# Patient Record
Sex: Female | Born: 1975 | Race: Black or African American | Hispanic: No | Marital: Married | Smoking: Current every day smoker
Health system: Southern US, Community
[De-identification: ages and names within clinical notes are randomized; demographics above are authoritative.]

## PROBLEM LIST (undated history)

## (undated) DIAGNOSIS — M79606 Pain in leg, unspecified: Secondary | ICD-10-CM

## (undated) DIAGNOSIS — E119 Type 2 diabetes mellitus without complications: Secondary | ICD-10-CM

## (undated) DIAGNOSIS — I10 Essential (primary) hypertension: Secondary | ICD-10-CM

## (undated) DIAGNOSIS — I509 Heart failure, unspecified: Secondary | ICD-10-CM

## (undated) DIAGNOSIS — E7800 Pure hypercholesterolemia, unspecified: Secondary | ICD-10-CM

## (undated) DIAGNOSIS — I38 Endocarditis, valve unspecified: Secondary | ICD-10-CM

## (undated) HISTORY — DX: Pain in leg, unspecified: M79.606

---

## 2010-08-21 ENCOUNTER — Emergency Department (HOSPITAL_BASED_OUTPATIENT_CLINIC_OR_DEPARTMENT_OTHER)
Admission: EM | Admit: 2010-08-21 | Discharge: 2010-08-21 | Payer: Self-pay | Source: Home / Self Care | Admitting: Emergency Medicine

## 2012-07-12 ENCOUNTER — Encounter (HOSPITAL_BASED_OUTPATIENT_CLINIC_OR_DEPARTMENT_OTHER): Payer: Self-pay | Admitting: *Deleted

## 2012-07-12 ENCOUNTER — Emergency Department (HOSPITAL_BASED_OUTPATIENT_CLINIC_OR_DEPARTMENT_OTHER)
Admission: EM | Admit: 2012-07-12 | Discharge: 2012-07-12 | Disposition: A | Discharge status: Home / Self Care | Payer: Medicaid Other | Attending: Emergency Medicine | Admitting: Emergency Medicine

## 2012-07-12 DIAGNOSIS — I10 Essential (primary) hypertension: Secondary | ICD-10-CM | POA: Insufficient documentation

## 2012-07-12 DIAGNOSIS — F17200 Nicotine dependence, unspecified, uncomplicated: Secondary | ICD-10-CM | POA: Insufficient documentation

## 2012-07-12 DIAGNOSIS — B86 Scabies: Secondary | ICD-10-CM | POA: Insufficient documentation

## 2012-07-12 DIAGNOSIS — L299 Pruritus, unspecified: Secondary | ICD-10-CM | POA: Insufficient documentation

## 2012-07-12 DIAGNOSIS — Z79899 Other long term (current) drug therapy: Secondary | ICD-10-CM | POA: Insufficient documentation

## 2012-07-12 HISTORY — DX: Essential (primary) hypertension: I10

## 2012-07-12 MED ORDER — PERMETHRIN 5 % EX CREA: TOPICAL_CREAM | CUTANEOUS | Status: DC

## 2012-07-12 MED ORDER — HYDROCHLOROTHIAZIDE 25 MG PO TABS: 25.0000 mg | ORAL_TABLET | Freq: Every day | ORAL | Status: DC

## 2012-07-12 NOTE — ED Notes (Signed)
Pt amb to triage with quick steady gait in nad. Pt reports itching all over x 2 weeks. States her sons stayed with a relative she believes to have scabies.

## 2012-07-12 NOTE — ED Provider Notes (Signed)
History     CSN: [id number]  Arrival date & time 07/12/12  1057   First MD Initiated Contact with Patient 07/12/12 1135      Chief Complaint  Patient presents with  . Rash    (Consider location/radiation/quality/duration/timing/severity/associated sxs/prior treatment) Patient is a 36 y.o. female presenting with rash.  Rash  This is a new problem. Episode onset: 2-3 weeks ago. The problem has been gradually worsening. Associated with: Other family members with same. There has been no fever. Affected Location: Diffuse but worse over the upper arm and neck. The pain is at a severity of 0/10. The patient is experiencing no pain. The pain has been constant since onset. Associated symptoms include itching. She has tried nothing for the symptoms. The treatment provided no relief.    Past Medical History  Diagnosis Date  . Hypertension     History reviewed. No pertinent past surgical history.  History reviewed. No pertinent family history.  History  Substance Use Topics  . Smoking status: Current Every Day Smoker  . Smokeless tobacco: Not on file  . Alcohol Use:     OB History    Grav Para Term Preterm Abortions TAB SAB Ect Mult Living                  Review of Systems  Skin: Positive for itching and rash.  All other systems reviewed and are negative.    Allergies  Review of patient's allergies indicates no known allergies.  Home Medications   Current Outpatient Rx  Name  Route  Sig  Dispense  Refill  . HYDROCHLOROTHIAZIDE 25 MG PO TABS   Oral   Take 1 tablet (25 mg total) by mouth daily.   30 tablet   3   . PERMETHRIN 5 % EX CREA      Apply to affected area once. Go to bed at night after you've apply the cream to wash off in the morning. If you're still itching in one week reapply a second time   60 g   0     BP 164/100  Pulse 92  Temp 98.2 F (36.8 C) (Oral)  Resp 18  SpO2 100%  LMP 07/04/2012  Physical Exam  Nursing note and vitals  reviewed. Constitutional: She is oriented to person, place, and time. She appears well-developed and well-nourished. No distress.  HENT:  Head: Normocephalic and atraumatic.  Eyes: EOM are normal. Pupils are equal, round, and reactive to light.  Pulmonary/Chest: Effort normal.  Neurological: She is alert and oriented to person, place, and time.  Skin: Skin is warm and dry. Rash noted. No erythema. No pallor.       Maculopapular excoriated rash more concentrated over the upper extremities and chest    ED Course  Procedures (including critical care time)  Labs Reviewed - No data to display No results found.   1. Scabies   2. Hypertension       MDM   Patient with evidence of scabies. Multiple other family members with similar symptoms. Given permethrin cream  Also patient is hypertensive here today but states she ran out of her hydrochlorothiazide 3 weeks ago. It was refill.        Gwyneth Sprout, MD 07/12/12 1515

## 2012-07-25 ENCOUNTER — Emergency Department (HOSPITAL_BASED_OUTPATIENT_CLINIC_OR_DEPARTMENT_OTHER)
Admission: EM | Admit: 2012-07-25 | Discharge: 2012-07-25 | Disposition: A | Discharge status: Home / Self Care | Payer: Medicaid Other | Attending: Emergency Medicine | Admitting: Emergency Medicine

## 2012-07-25 ENCOUNTER — Encounter (HOSPITAL_BASED_OUTPATIENT_CLINIC_OR_DEPARTMENT_OTHER): Payer: Self-pay | Admitting: *Deleted

## 2012-07-25 DIAGNOSIS — I10 Essential (primary) hypertension: Secondary | ICD-10-CM | POA: Insufficient documentation

## 2012-07-25 DIAGNOSIS — F17200 Nicotine dependence, unspecified, uncomplicated: Secondary | ICD-10-CM | POA: Insufficient documentation

## 2012-07-25 DIAGNOSIS — B86 Scabies: Secondary | ICD-10-CM | POA: Insufficient documentation

## 2012-07-25 MED ORDER — PERMETHRIN 5 % EX CREA: TOPICAL_CREAM | CUTANEOUS | Status: DC

## 2012-07-25 NOTE — ED Notes (Signed)
Pt c/o rash seen 11/19 for scabies and treated w/o relief

## 2012-07-25 NOTE — ED Provider Notes (Signed)
Medical screening examination/treatment/procedure(s) were performed by non-physician practitioner and as supervising physician I was immediately available for consultation/collaboration.   Charles B. Bernette Mayers, MD 07/25/12 [phone]

## 2012-07-25 NOTE — ED Provider Notes (Signed)
History     CSN: [id number]  Arrival date & time 07/25/12  1523   First MD Initiated Contact with Patient 07/25/12 1539      Chief Complaint  Patient presents with  . Rash    (Consider location/radiation/quality/duration/timing/severity/associated sxs/prior treatment) Patient is a 36 y.o. female presenting with rash. The history is provided by the patient. No language interpreter was used.  Rash  This is a new problem. The current episode started more than 1 week ago. The problem has been gradually worsening. The rash is present on the torso, abdomen and trunk. The patient is experiencing no pain. Associated symptoms include itching. She has tried nothing for the symptoms.    Past Medical History  Diagnosis Date  . Hypertension     History reviewed. No pertinent past surgical history.  History reviewed. No pertinent family history.  History  Substance Use Topics  . Smoking status: Current Every Day Smoker  . Smokeless tobacco: Not on file  . Alcohol Use:     OB History    Grav Para Term Preterm Abortions TAB SAB Ect Mult Living                  Review of Systems  Skin: Positive for itching and rash.  All other systems reviewed and are negative.    Allergies  Review of patient's allergies indicates no known allergies.  Home Medications   Current Outpatient Rx  Name  Route  Sig  Dispense  Refill  . HYDROCHLOROTHIAZIDE 25 MG PO TABS   Oral   Take 1 tablet (25 mg total) by mouth daily.   30 tablet   3   . PERMETHRIN 5 % EX CREA      Apply to affected area once. Go to bed at night after you've apply the cream to wash off in the morning. If you're still itching in one week reapply a second time   60 g   2     BP 185/99  Pulse 118  Temp 98.9 F (37.2 C) (Oral)  Resp 16  SpO2 100%  LMP 07/04/2012  Physical Exam  Nursing note and vitals reviewed. Constitutional: She is oriented to person, place, and time. She appears well-developed and  well-nourished.  HENT:  Head: Normocephalic.  Eyes: Pupils are equal, round, and reactive to light.  Cardiovascular: Normal rate.   Abdominal: Soft.  Musculoskeletal: Normal range of motion.  Neurological: She is alert and oriented to person, place, and time.  Skin: Rash noted.  Psychiatric: She has a normal mood and affect.    ED Course  Procedures (including critical care time)  Labs Reviewed - No data to display No results found.   1. Scabies       MDM  elemite        Lonia Skinner [place], [place] 07/25/12 1609

## 2012-08-16 ENCOUNTER — Emergency Department (HOSPITAL_BASED_OUTPATIENT_CLINIC_OR_DEPARTMENT_OTHER)
Admission: EM | Admit: 2012-08-16 | Discharge: 2012-08-16 | Disposition: A | Discharge status: Home / Self Care | Payer: Medicaid Other | Attending: Emergency Medicine | Admitting: Emergency Medicine

## 2012-08-16 ENCOUNTER — Encounter (HOSPITAL_BASED_OUTPATIENT_CLINIC_OR_DEPARTMENT_OTHER): Payer: Self-pay | Admitting: *Deleted

## 2012-08-16 DIAGNOSIS — L299 Pruritus, unspecified: Secondary | ICD-10-CM | POA: Insufficient documentation

## 2012-08-16 DIAGNOSIS — Z79899 Other long term (current) drug therapy: Secondary | ICD-10-CM | POA: Insufficient documentation

## 2012-08-16 DIAGNOSIS — F17200 Nicotine dependence, unspecified, uncomplicated: Secondary | ICD-10-CM | POA: Insufficient documentation

## 2012-08-16 DIAGNOSIS — B86 Scabies: Secondary | ICD-10-CM

## 2012-08-16 DIAGNOSIS — I10 Essential (primary) hypertension: Secondary | ICD-10-CM | POA: Insufficient documentation

## 2012-08-16 MED ORDER — HYDROXYZINE HCL 25 MG PO TABS: 25.0000 mg | ORAL_TABLET | Freq: Four times a day (QID) | ORAL | Status: DC

## 2012-08-16 MED ORDER — PERMETHRIN 5 % EX CREA: TOPICAL_CREAM | CUTANEOUS | Status: DC

## 2012-08-16 MED ORDER — LORAZEPAM 1 MG PO TABS: 1.0000 mg | ORAL_TABLET | Freq: Three times a day (TID) | ORAL | Status: DC

## 2012-08-16 NOTE — ED Notes (Signed)
States she has been treated for scabies several times but cannot seem to get rid of them. Now her family members have it.

## 2012-08-16 NOTE — ED Provider Notes (Signed)
History     CSN: [id number]  Arrival date & time 08/16/12  1635   First MD Initiated Contact with Patient 08/16/12 1749      Chief Complaint  Patient presents with  . Rash    (Consider location/radiation/quality/duration/timing/severity/associated sxs/prior treatment) Patient is a 36 y.o. female presenting with rash. The history is provided by the patient. No language interpreter was used.  Rash  This is a new problem. The current episode started more than 1 week ago. The problem has been gradually worsening. The problem is associated with nothing. There has been no fever. The rash is present on the abdomen and torso. The pain is moderate. Associated symptoms include itching. Treatments tried: elemite.  Pt reports she has scabies again.  Pt complains that she and her son are itching.   Pt reports itching all night.  Pt reports worrying about scabies and having problems with anxiety  Past Medical History  Diagnosis Date  . Hypertension     History reviewed. No pertinent past surgical history.  No family history on file.  History  Substance Use Topics  . Smoking status: Current Every Day Smoker -- 1.5 packs/day    Types: Cigarettes  . Smokeless tobacco: Not on file  . Alcohol Use: Yes    OB History    Grav Para Term Preterm Abortions TAB SAB Ect Mult Living                  Review of Systems  Skin: Positive for itching and rash.  All other systems reviewed and are negative.    Allergies  Review of patient's allergies indicates no known allergies.  Home Medications   Current Outpatient Rx  Name  Route  Sig  Dispense  Refill  . HYDROCHLOROTHIAZIDE 25 MG PO TABS   Oral   Take 1 tablet (25 mg total) by mouth daily.   30 tablet   3   . HYDROXYZINE HCL 25 MG PO TABS   Oral   Take 1 tablet (25 mg total) by mouth every 6 (six) hours.   12 tablet   0   . LORAZEPAM 1 MG PO TABS   Oral   Take 1 tablet (1 mg total) by mouth every 8 (eight) hours.   10  tablet   0   . PERMETHRIN 5 % EX CREA      Apply to affected area once. Go to bed at night after you've apply the cream to wash off in the morning. If you're still itching in one week reapply a second time   60 g   2   . PERMETHRIN 5 % EX CREA      Apply to affected area once   120 g   1     BP 177/98  Pulse 93  Temp 98.4 F (36.9 C) (Oral)  Resp 18  SpO2 100%  Physical Exam  Nursing note and vitals reviewed. Constitutional: She is oriented to person, place, and time. She appears well-developed and well-nourished.  HENT:  Head: Normocephalic.  Neck: Normal range of motion.  Cardiovascular: Normal rate and normal heart sounds.   Pulmonary/Chest: Effort normal and breath sounds normal.  Abdominal: Soft.  Musculoskeletal: Normal range of motion.  Neurological: She is alert and oriented to person, place, and time. She has normal reflexes.  Skin: Rash noted.  Psychiatric: She has a normal mood and affect.    ED Course  Procedures (including critical care time)  Labs Reviewed - No data  to display No results found.   1. Scabies   2. Itching       MDM  Pt has some dried scabed areas,  Pt son has eczema and is difficult to tell if he has scabies.   I will retreat.   Pt also given atarax for itching.   I gave ativan for anxiety for limited time         Elson Areas, [place] 08/16/12 2145  Lonia Skinner [place], [place] 08/16/12 2146

## 2012-08-17 NOTE — ED Provider Notes (Signed)
Medical screening examination/treatment/procedure(s) were performed by non-physician practitioner and as supervising physician I was immediately available for consultation/collaboration.  Derwood Kaplan, MD 08/17/12 [phone]

## 2012-12-12 ENCOUNTER — Encounter (HOSPITAL_BASED_OUTPATIENT_CLINIC_OR_DEPARTMENT_OTHER): Payer: Self-pay | Admitting: *Deleted

## 2012-12-12 ENCOUNTER — Emergency Department (HOSPITAL_BASED_OUTPATIENT_CLINIC_OR_DEPARTMENT_OTHER): Payer: Medicaid Other

## 2012-12-12 ENCOUNTER — Other Ambulatory Visit: Payer: Self-pay

## 2012-12-12 ENCOUNTER — Emergency Department (HOSPITAL_BASED_OUTPATIENT_CLINIC_OR_DEPARTMENT_OTHER)
Admission: EM | Admit: 2012-12-12 | Discharge: 2012-12-12 | Disposition: A | Discharge status: Home / Self Care | Payer: Medicaid Other | Attending: Emergency Medicine | Admitting: Emergency Medicine

## 2012-12-12 DIAGNOSIS — O99330 Smoking (tobacco) complicating pregnancy, unspecified trimester: Secondary | ICD-10-CM | POA: Insufficient documentation

## 2012-12-12 DIAGNOSIS — O2650 Maternal hypotension syndrome, unspecified trimester: Secondary | ICD-10-CM | POA: Insufficient documentation

## 2012-12-12 DIAGNOSIS — Z79899 Other long term (current) drug therapy: Secondary | ICD-10-CM | POA: Insufficient documentation

## 2012-12-12 DIAGNOSIS — R42 Dizziness and giddiness: Secondary | ICD-10-CM | POA: Insufficient documentation

## 2012-12-12 DIAGNOSIS — O9989 Other specified diseases and conditions complicating pregnancy, childbirth and the puerperium: Secondary | ICD-10-CM | POA: Insufficient documentation

## 2012-12-12 DIAGNOSIS — I951 Orthostatic hypotension: Secondary | ICD-10-CM

## 2012-12-12 DIAGNOSIS — Z3490 Encounter for supervision of normal pregnancy, unspecified, unspecified trimester: Secondary | ICD-10-CM

## 2012-12-12 DIAGNOSIS — E669 Obesity, unspecified: Secondary | ICD-10-CM | POA: Insufficient documentation

## 2012-12-12 DIAGNOSIS — O169 Unspecified maternal hypertension, unspecified trimester: Secondary | ICD-10-CM | POA: Insufficient documentation

## 2012-12-12 DIAGNOSIS — K625 Hemorrhage of anus and rectum: Secondary | ICD-10-CM | POA: Insufficient documentation

## 2012-12-12 DIAGNOSIS — K644 Residual hemorrhoidal skin tags: Secondary | ICD-10-CM | POA: Insufficient documentation

## 2012-12-12 LAB — URINALYSIS, ROUTINE W REFLEX MICROSCOPIC
Bilirubin Urine: NEGATIVE
Glucose, UA: NEGATIVE mg/dL
Hgb urine dipstick: NEGATIVE
Ketones, ur: 15 mg/dL — AB
Nitrite: NEGATIVE
Specific Gravity, Urine: 1.022 (ref 1.005–1.030)

## 2012-12-12 LAB — CBC WITH DIFFERENTIAL/PLATELET
Basophils Absolute: 0 10*3/uL (ref 0.0–0.1)
Basophils Relative: 0 % (ref 0–1)
Eosinophils Absolute: 0.2 10*3/uL (ref 0.0–0.7)
Eosinophils Relative: 2 % (ref 0–5)
HCT: 34.1 % — ABNORMAL LOW (ref 36.0–46.0)
Hemoglobin: 11.3 g/dL — ABNORMAL LOW (ref 12.0–15.0)
MCH: 25.8 pg — ABNORMAL LOW (ref 26.0–34.0)
MCHC: 33.1 g/dL (ref 30.0–36.0)
Monocytes Absolute: 0.8 10*3/uL (ref 0.1–1.0)
Neutrophils Relative %: 61 % (ref 43–77)

## 2012-12-12 LAB — COMPREHENSIVE METABOLIC PANEL
ALT: 19 U/L (ref 0–35)
BUN: 7 mg/dL (ref 6–23)
Creatinine, Ser: 0.9 mg/dL (ref 0.50–1.10)
Sodium: 138 mEq/L (ref 135–145)

## 2012-12-12 LAB — OCCULT BLOOD X 1 CARD TO LAB, STOOL: Fecal Occult Bld: POSITIVE — AB

## 2012-12-12 LAB — WET PREP, GENITAL: Yeast Wet Prep HPF POC: NONE SEEN

## 2012-12-12 LAB — URINE MICROSCOPIC-ADD ON

## 2012-12-12 IMAGING — US US OB TRANSVAGINAL
1 series · 14 of 18 positions shown · non-contrast
Comparison: None.

CLINICAL DATA: Hyperemesis.  Dizziness.

OBSTETRIC <14 WK US AND TRANSVAGINAL OB US
TECHNIQUE: Both transabdominal and transvaginal ultrasound
examinations were performed for complete evaluation of the
gestation as well as the maternal uterus, adnexal regions, and
pelvic cul-de-sac.  Transvaginal technique was performed to assess
early pregnancy.

[Series 1: us ob transvaginal · 0.26mm/px · 14 of 18 slices shown]
[im 1/18]
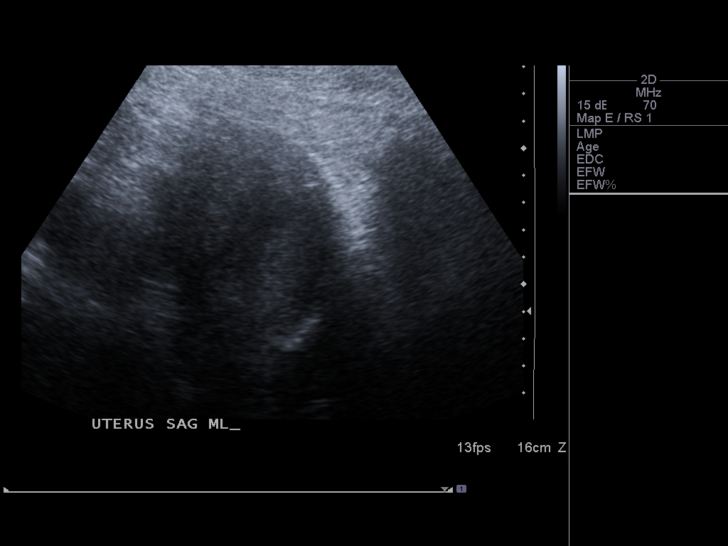
[im 2/18]
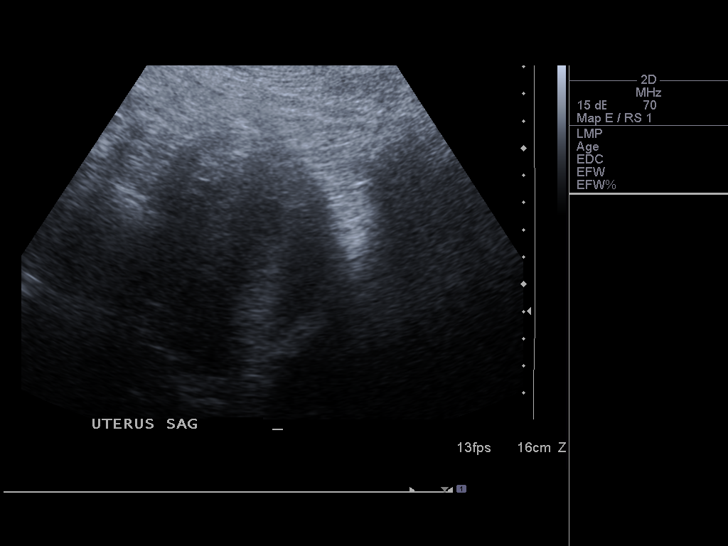
[im 4/18]
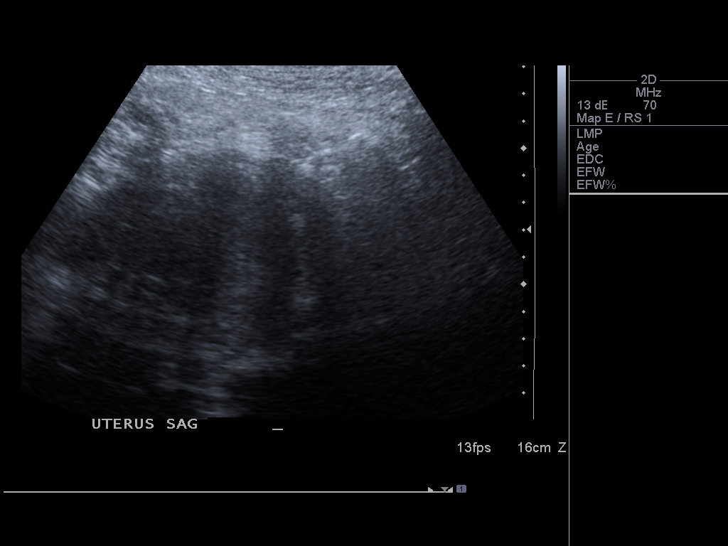
[im 5/18]
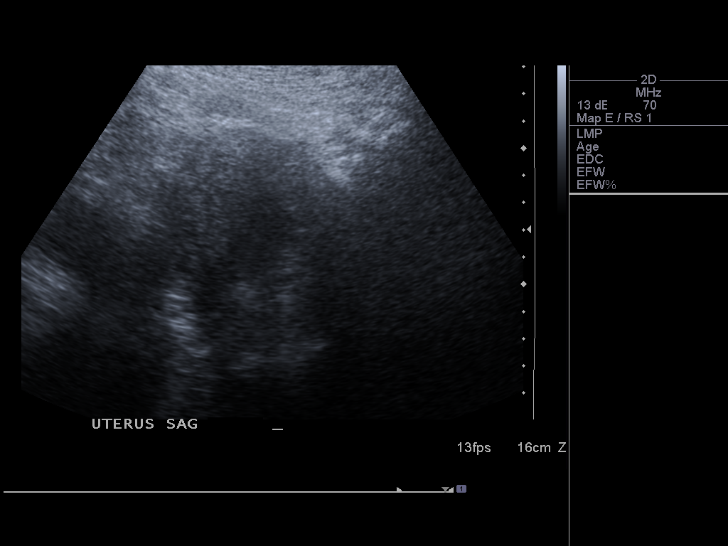
[im 6/18]
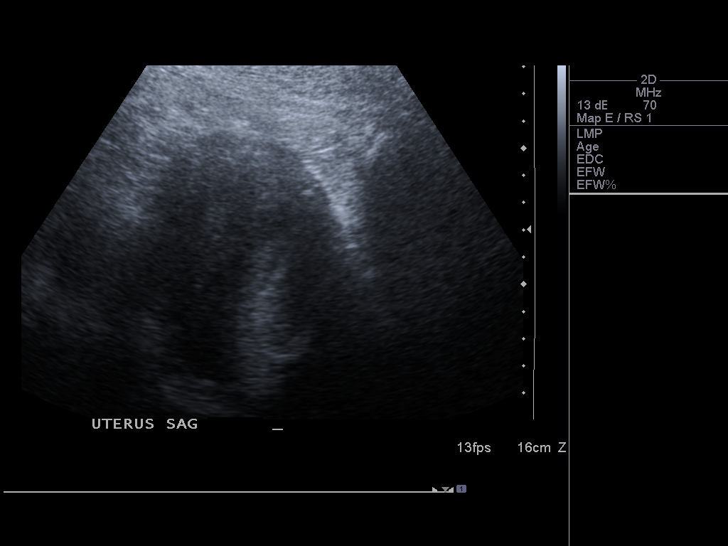
[im 8/18]
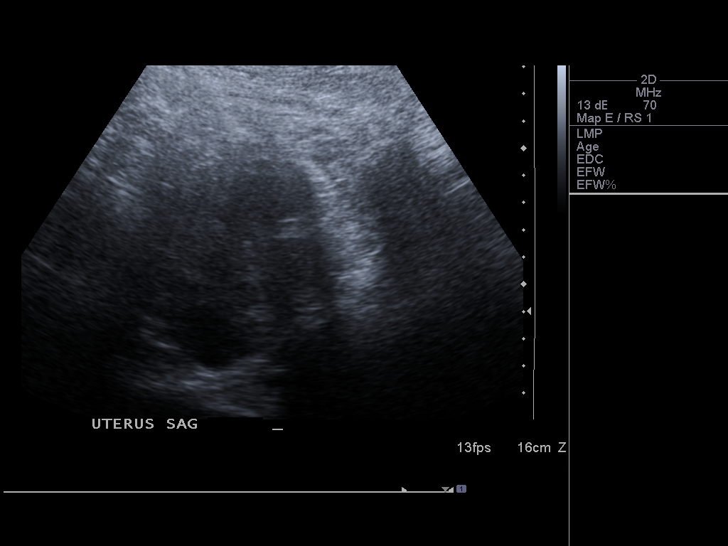
[im 9/18]
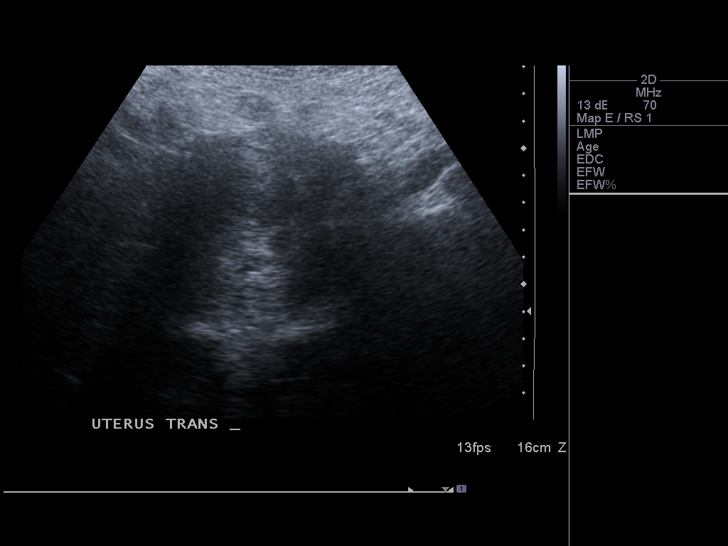
[im 10/18]
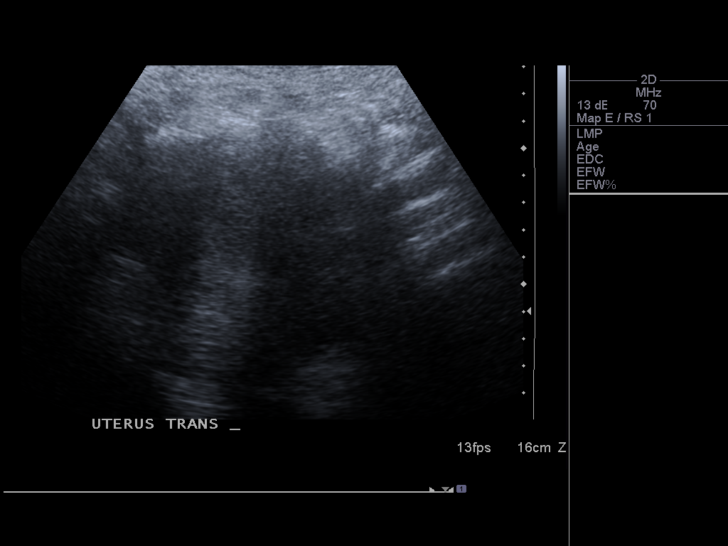
[im 11/18]
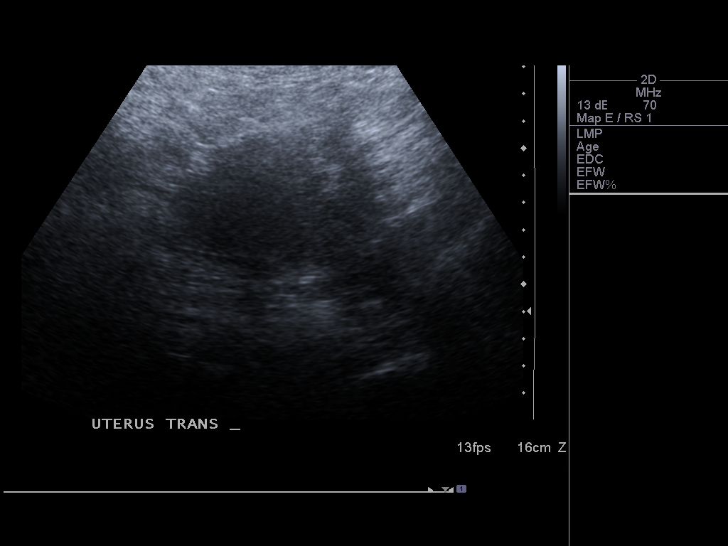
[im 13/18]
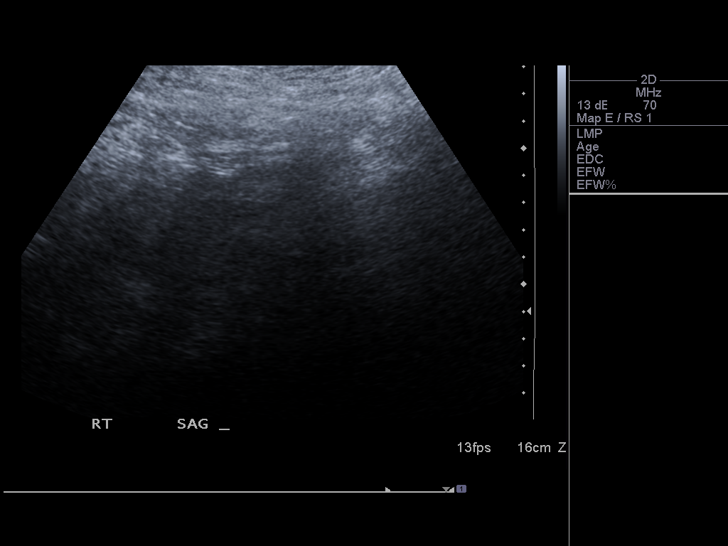
[im 14/18]
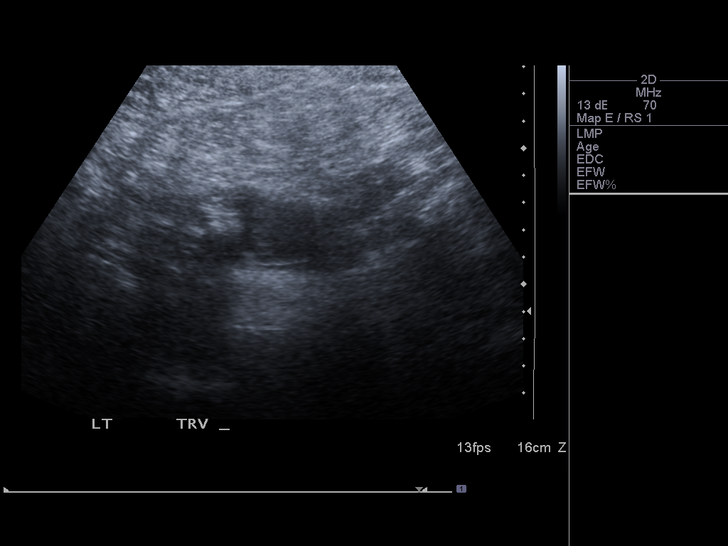
[im 15/18]
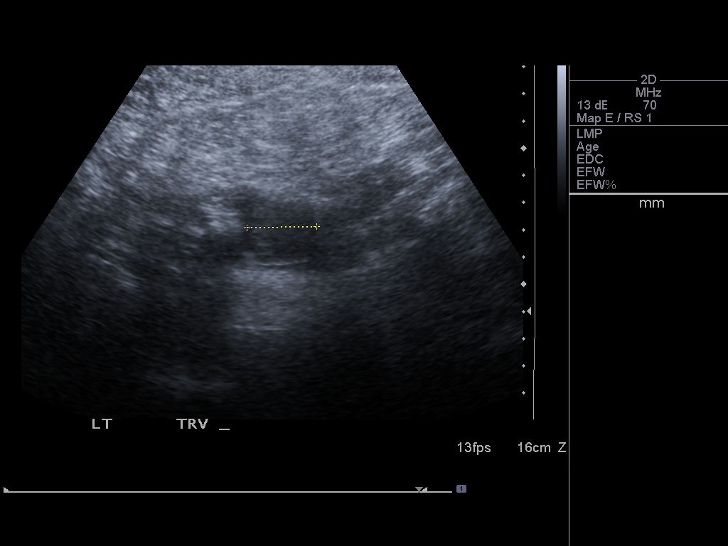
[im 17/18]
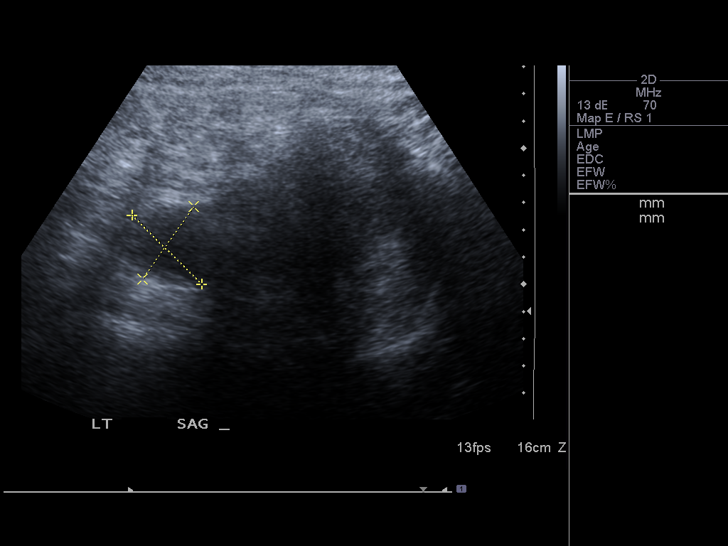
[im 18/18]
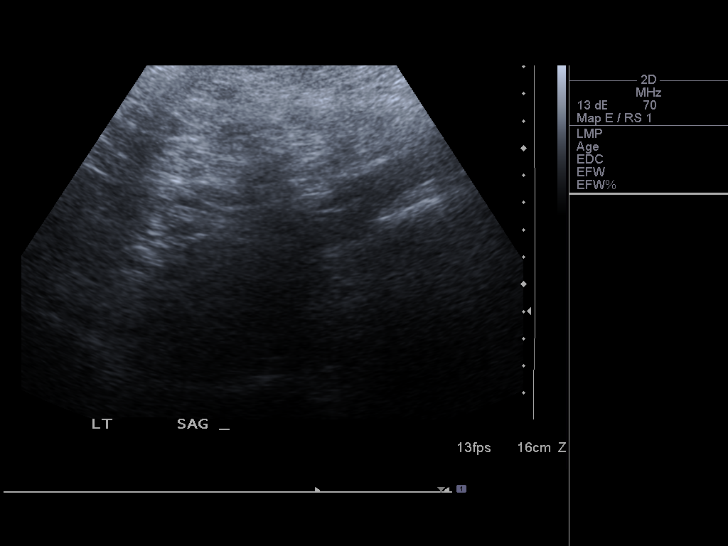

[14 of 18 positions shown; findings below may reference images not displayed]

Intrauterine gestational sac:  Visualized/normal in shape.
Yolk sac: Visualized
Embryo: Not visualized
Cardiac Activity: Not visualized

MSD: 10  mm  5 w  5 d

Maternal uterus/adnexae:
A fibroid is seen in the right upper uterine corpus or fundus
measuring 4.0 cm in maximum diameter. Small left ovarian corpus
luteum noted.  No adnexal mass or free fluid identified.
IMPRESSION: 1.  Single intrauterine gestational sac measuring approximately 5
weeks 5 days by MSD. This is concordant with given LMP.
2. Small uterine fibroid.

## 2012-12-12 MED ORDER — DOCUSATE SODIUM 100 MG PO CAPS: 100.0000 mg | ORAL_CAPSULE | Freq: Two times a day (BID) | ORAL | Status: DC

## 2012-12-12 MED ORDER — HYDROCORTISONE 2.5 % RE CREA: TOPICAL_CREAM | RECTAL | Status: DC

## 2012-12-12 MED ORDER — SODIUM CHLORIDE 0.9 % IV BOLUS (SEPSIS)
1000.0000 mL | Freq: Once | INTRAVENOUS | Status: AC
  Administered 2012-12-12: 1000 mL via INTRAVENOUS

## 2012-12-12 MED ORDER — ONDANSETRON HCL 4 MG PO TABS: 4.0000 mg | ORAL_TABLET | Freq: Four times a day (QID) | ORAL | Status: DC

## 2012-12-12 MED ORDER — METRONIDAZOLE 500 MG PO TABS: 500.0000 mg | ORAL_TABLET | Freq: Two times a day (BID) | ORAL | Status: DC

## 2012-12-12 MED ORDER — ONDANSETRON HCL 4 MG/2ML IJ SOLN
4.0000 mg | Freq: Once | INTRAMUSCULAR | Status: AC
  Administered 2012-12-12: 4 mg via INTRAVENOUS
  Filled 2012-12-12: qty 2

## 2012-12-12 NOTE — ED Provider Notes (Signed)
History    This chart was scribed for Glynn Octave, MD by Leone Payor, ED Scribe. This patient was seen in room MH09/MH09 and the patient's care was started 5:07 PM.    CSN: [id number]  Arrival date & time 12/12/12  1513   None     Chief Complaint  Patient presents with  . Morning Sickness  . Dizziness     The history is provided by the patient. No language interpreter was used.    Gabriela Thompson is a 37 y.o. female who presents to the Emergency Department complaining of ongoing, unchanged nausea with vomiting (x1 daily) starting about 6 weeks ago. Pt states she had second dizzy spell while standing today. She describes it as lightheadedness. Her LNMP was 6 weeks ago. She has had a pregnancy test from the hospital but no ultrasound. She also complains of dark red bloody stools but stool color is normal. Does not hurt to have a BM but has blood with wiping. Denies black/tarry stools. She denies chest pain, SOB, vaginal bleeding. Denies h/o blood clots. This is pt's 6th pregnancy.   Pt is a current everyday smoker and occasional alcohol user.  Past Medical History  Diagnosis Date  . Hypertension     History reviewed. No pertinent past surgical history.  History reviewed. No pertinent family history.  History  Substance Use Topics  . Smoking status: Current Every Day Smoker -- 1.50 packs/day    Types: Cigarettes  . Smokeless tobacco: Not on file  . Alcohol Use: Yes     OB History   Grav Para Term Preterm Abortions TAB SAB Ect Mult Living                   Review of Systems A complete 10 system review of systems was obtained and all systems are negative except as noted in the HPI and PMH.   Allergies  Review of patient's allergies indicates no known allergies.  Home Medications   Current Outpatient Rx  Name  Route  Sig  Dispense  Refill  . methyldopa (ALDOMET) 500 MG tablet   Oral   Take 500 mg by mouth 3 (three) times daily.         . ondansetron  (ZOFRAN-ODT) 4 MG disintegrating tablet   Oral   Take 4 mg by mouth every 8 (eight) hours as needed for nausea.         . Prenatal Vit-Fe Fumarate-FA (PRENATAL MULTIVITAMIN) TABS   Oral   Take 1 tablet by mouth daily at 12 noon.         . docusate sodium (COLACE) 100 MG capsule   Oral   Take 1 capsule (100 mg total) by mouth every 12 (twelve) hours.   60 capsule   0   . hydrochlorothiazide (HYDRODIURIL) 25 MG tablet   Oral   Take 1 tablet (25 mg total) by mouth daily.   30 tablet   3   . hydrocortisone (ANUSOL-HC) 2.5 % rectal cream      Apply rectally 2 times daily   30 g   0   . hydrOXYzine (ATARAX/VISTARIL) 25 MG tablet   Oral   Take 1 tablet (25 mg total) by mouth every 6 (six) hours.   12 tablet   0   . LORazepam (ATIVAN) 1 MG tablet   Oral   Take 1 tablet (1 mg total) by mouth every 8 (eight) hours.   10 tablet   0   . metroNIDAZOLE (FLAGYL)  500 MG tablet   Oral   Take 1 tablet (500 mg total) by mouth 2 (two) times daily.   14 tablet   0   . ondansetron (ZOFRAN) 4 MG tablet   Oral   Take 1 tablet (4 mg total) by mouth every 6 (six) hours.   12 tablet   0   . permethrin (ELIMITE) 5 % cream      Apply to affected area once. Go to bed at night after you've apply the cream to wash off in the morning. If you're still itching in one week reapply a second time   60 g   2   . permethrin (ELIMITE) 5 % cream      Apply to affected area once   120 g   1     BP 131/72  Pulse 103  Temp(Src) 98.3 F (36.8 C) (Oral)  Resp 18  Ht 5\' 11"  (1.803 m)  Wt 388 lb (175.996 kg)  BMI 54.14 kg/m2  SpO2 100%  LMP 11/02/2012  Physical Exam  Nursing note and vitals reviewed. Constitutional: She is oriented to person, place, and time. She appears well-developed and well-nourished. No distress.  Morbidly obese.   HENT:  Head: Normocephalic and atraumatic.  Mouth/Throat: Oropharynx is clear and moist.  Moist mucous membranes.    Eyes: EOM are normal.   Pale conjunctiva  Neck: Neck supple. No tracheal deviation present.  Cardiovascular: Normal rate, regular rhythm and normal heart sounds.   Pulmonary/Chest: Effort normal and breath sounds normal. No respiratory distress. She has no wheezes. She has no rales.  Abdominal: Soft. There is no tenderness.  Genitourinary:  No CMT. No adnexal tenderness. Cervix is closed. Scant white discharge.   Has a small external hemorrhoid. No gross blood, stool is brown.    Musculoskeletal: Normal range of motion.  Neurological: She is alert and oriented to person, place, and time.  Skin: Skin is warm and dry.  Psychiatric: She has a normal mood and affect. Her behavior is normal.    ED Course  Procedures (including critical care time)  DIAGNOSTIC STUDIES: Oxygen Saturation is 100% on room air, normal by my interpretation.    COORDINATION OF CARE: 5:17 PM-Discussed treatment plan with pt at bedside and pt agreed to plan.    Labs Reviewed  WET PREP, GENITAL - Abnormal; Notable for the following:    Clue Cells Wet Prep HPF POC FEW (*)    WBC, Wet Prep HPF POC FEW (*)    All other components within normal limits  CBC WITH DIFFERENTIAL - Abnormal; Notable for the following:    WBC 11.7 (*)    Hemoglobin 11.3 (*)    HCT 34.1 (*)    MCV 77.9 (*)    MCH 25.8 (*)    RDW 19.1 (*)    All other components within normal limits  COMPREHENSIVE METABOLIC PANEL - Abnormal; Notable for the following:    Glucose, Bld 108 (*)    Albumin 3.0 (*)    Total Bilirubin 0.1 (*)    GFR calc non Af Amer 81 (*)    All other components within normal limits  HCG, QUANTITATIVE, PREGNANCY - Abnormal; Notable for the following:    hCG, Beta Chain, Quant, S 5127 (*)    All other components within normal limits  URINALYSIS, ROUTINE W REFLEX MICROSCOPIC - Abnormal; Notable for the following:    APPearance CLOUDY (*)    Ketones, ur 15 (*)    Leukocytes, UA TRACE (*)  All other components within normal limits   PREGNANCY, URINE - Abnormal; Notable for the following:    Preg Test, Ur POSITIVE (*)    All other components within normal limits  URINE MICROSCOPIC-ADD ON - Abnormal; Notable for the following:    Squamous Epithelial / LPF MANY (*)    All other components within normal limits  OCCULT BLOOD X 1 CARD TO LAB, STOOL - Abnormal; Notable for the following:    Fecal Occult Bld POSITIVE (*)    All other components within normal limits  GC/CHLAMYDIA PROBE AMP   US Ob Comp Less 14 Wks  12/12/2012  *RADIOLOGY REPORT*  Clinical Data: Hyperemesis.  Dizziness.  OBSTETRIC <14 WK [place] AND TRANSVAGINAL OB US  Technique:  Both transabdominal and transvaginal ultrasound examinations were performed for complete evaluation of the gestation as well as the maternal uterus, adnexal regions, and pelvic cul-de-sac.  Transvaginal technique was performed to assess early pregnancy.  Comparison:  None.  Intrauterine gestational sac:  Visualized/normal in shape. Yolk sac: Visualized Embryo: Not visualized Cardiac Activity: Not visualized  MSD: 10  mm  5 w  5 d  Maternal uterus/adnexae: A fibroid is seen in the right upper uterine corpus or fundus measuring 4.0 cm in maximum diameter. Small left ovarian corpus luteum noted.  No adnexal mass or free fluid identified.  IMPRESSION:  1.  Single intrauterine gestational sac measuring approximately 5 weeks 5 days by MSD. This is concordant with given LMP. 2. Small uterine fibroid.   Original Report Authenticated By: Myles Rosenthal, M.D.    US Ob Transvaginal  12/12/2012  *RADIOLOGY REPORT*  Clinical Data: Hyperemesis.  Dizziness.  OBSTETRIC <14 WK [place] AND TRANSVAGINAL OB US  Technique:  Both transabdominal and transvaginal ultrasound examinations were performed for complete evaluation of the gestation as well as the maternal uterus, adnexal regions, and pelvic cul-de-sac.  Transvaginal technique was performed to assess early pregnancy.  Comparison:  None.  Intrauterine gestational sac:   Visualized/normal in shape. Yolk sac: Visualized Embryo: Not visualized Cardiac Activity: Not visualized  MSD: 10  mm  5 w  5 d  Maternal uterus/adnexae: A fibroid is seen in the right upper uterine corpus or fundus measuring 4.0 cm in maximum diameter. Small left ovarian corpus luteum noted.  No adnexal mass or free fluid identified.  IMPRESSION:  1.  Single intrauterine gestational sac measuring approximately 5 weeks 5 days by MSD. This is concordant with given LMP. 2. Small uterine fibroid.   Original Report Authenticated By: Myles Rosenthal, M.D.      1. Intrauterine pregnancy   2. Rectal bleeding   3. Orthostatic hypotension       MDM  [redacted] weeks pregnant by dates with nausea and vomiting daily. 2 episodes of dizziness with standing and near-syncope. Patient has had blood on the outside of her brown stool or blood with wiping. Denies any black stool.  Intrauterine pregnancy on ultrasound. Brown stools that are guaic positive. Hemoglobin 11.5. Orthostatics mildly positive.  Tolerating by mouth in the ED with no vomiting. Followup with OB this week. Suspect hemorrhoids as source of bloody stool. We'll treat with Anusol and Colace.   Date: 12/12/2012  Rate: 88  Rhythm: normal sinus rhythm  QRS Axis: normal  Intervals: normal  ST/T Wave abnormalities: none  Conduction Disutrbances:none  Narrative Interpretation:   Old EKG Reviewed: none available    I personally performed the services described in this documentation, which was scribed in my presence. The recorded information has  been reviewed and is accurate.   Glynn Octave, MD 12/12/12 2026

## 2012-12-12 NOTE — ED Notes (Signed)
Pt amb to triage with quick steady gait in nad. Pt states she is [redacted] weeks pregnant and she has had morning sickness every morning. Pt states she felt dizzy earlier "and I almost passed out". Pt states that she feels weak. Last emesis two hours ago.

## 2012-12-12 NOTE — ED Notes (Signed)
MD at bedside discussing results and care plan with patient.

## 2012-12-13 LAB — GC/CHLAMYDIA PROBE AMP: CT Probe RNA: NEGATIVE

## 2013-02-01 ENCOUNTER — Other Ambulatory Visit (HOSPITAL_COMMUNITY): Payer: Self-pay | Admitting: Obstetrics and Gynecology

## 2013-02-01 ENCOUNTER — Other Ambulatory Visit: Payer: Self-pay

## 2013-02-01 DIAGNOSIS — Z3689 Encounter for other specified antenatal screening: Secondary | ICD-10-CM

## 2013-02-01 DIAGNOSIS — O09522 Supervision of elderly multigravida, second trimester: Secondary | ICD-10-CM

## 2013-03-01 ENCOUNTER — Encounter (HOSPITAL_COMMUNITY): Payer: Self-pay | Admitting: Obstetrics and Gynecology

## 2013-03-08 ENCOUNTER — Ambulatory Visit (HOSPITAL_COMMUNITY)
Admission: RE | Admit: 2013-03-08 | Discharge: 2013-03-08 | Disposition: A | Discharge status: Home / Self Care | Payer: Medicaid Other | Source: Ambulatory Visit | Attending: Obstetrics and Gynecology | Admitting: Obstetrics and Gynecology

## 2013-03-08 ENCOUNTER — Encounter (HOSPITAL_COMMUNITY): Payer: Self-pay

## 2013-03-08 VITALS — BP 152/82 | HR 100 | Wt >= 6400 oz

## 2013-03-08 DIAGNOSIS — Z1389 Encounter for screening for other disorder: Secondary | ICD-10-CM | POA: Insufficient documentation

## 2013-03-08 DIAGNOSIS — O358XX0 Maternal care for other (suspected) fetal abnormality and damage, not applicable or unspecified: Secondary | ICD-10-CM | POA: Insufficient documentation

## 2013-03-08 DIAGNOSIS — Z3689 Encounter for other specified antenatal screening: Secondary | ICD-10-CM

## 2013-03-08 DIAGNOSIS — O99330 Smoking (tobacco) complicating pregnancy, unspecified trimester: Secondary | ICD-10-CM | POA: Insufficient documentation

## 2013-03-08 DIAGNOSIS — Z363 Encounter for antenatal screening for malformations: Secondary | ICD-10-CM | POA: Insufficient documentation

## 2013-03-08 DIAGNOSIS — O09299 Supervision of pregnancy with other poor reproductive or obstetric history, unspecified trimester: Secondary | ICD-10-CM | POA: Insufficient documentation

## 2013-03-08 DIAGNOSIS — O09529 Supervision of elderly multigravida, unspecified trimester: Secondary | ICD-10-CM | POA: Insufficient documentation

## 2013-03-08 DIAGNOSIS — O10019 Pre-existing essential hypertension complicating pregnancy, unspecified trimester: Secondary | ICD-10-CM | POA: Insufficient documentation

## 2013-03-08 DIAGNOSIS — O360990 Maternal care for other rhesus isoimmunization, unspecified trimester, not applicable or unspecified: Secondary | ICD-10-CM | POA: Insufficient documentation

## 2013-03-08 DIAGNOSIS — O09522 Supervision of elderly multigravida, second trimester: Secondary | ICD-10-CM

## 2013-03-08 NOTE — Progress Notes (Signed)
Lenisha Lacap  was seen today for an ultrasound appointment.  See full report in [facility].  Comments: Ms. Tramel was seen today due to advanced maternal age.  She previously underwent NIPS (cell free fetal DNA) that returned low risk for fetal aneuploidy.  See separate note from [employer], broadcasting/film/video.  Additionally, the patient reports a history of binge drinking prior to finding out that she was pregnant.  Impression: Single IUP at 18 0/7 weeks Normal anatomic fetal survey.  Limited views of the spine, kidneys and heart were obtained due to maternal body habitus and fetal position. No markers associated with aneuploidy were noted. Uterine myoma noted as described above. Normal amniotic fluid volume.  Recommendations: Recommend follow up in 4 weeks to reevaluate the heart and fetal anatomy. Will schedule fetal echo due to hx of binge drinking early during the course of pregnancy.  Alpha Gula, MD

## 2013-03-08 NOTE — Progress Notes (Signed)
Genetic Counseling  High-Risk Gestation Note  Appointment Date:  03/08/2013 Referred By: Dahlia Bailiff, MD Date of Birth:  [birth year] Partner:  Gabriela Thompson   Pregnancy History: [id number] Estimated Date of Delivery: 08/09/13 Estimated Gestational Age: [redacted]w[redacted]d Attending: Alpha Gula, MD   Ms. Gabriela Thompson, and her partner, Mr. Gabriela Thompson, were seen for genetic counseling regarding a maternal age of 37 y.o.Marland Kitchen  They were counseled regarding maternal age and the association with risk for chromosome conditions due to nondisjunction with aging of the ova.   We reviewed chromosomes, nondisjunction, and the associated 1 in 100 risk for fetal aneuploidy related to a maternal age of 37 y.o. at [redacted]w[redacted]d weeks gestation.  They were counseled that the risk for aneuploidy decreases as gestational age increases, accounting for those pregnancies which spontaneously abort.  We specifically discussed Down syndrome (trisomy 87), trisomies 3 and 31, and sex chromosome aneuploidies (47,XXX and 47,XXY) including the common features and prognoses of each.   We also reviewed GabrielaThompson's cell free fetal DNA (cffDNA) test result.  Gabriela Thompson had [facility] screening performed through her referring OB's office.  We reviewed that these are within normal limits, showing a less than 1 in 10,000 risk for trisomies 21, 18 and 13.  In addition, we discussed that the Y chromosome analysis was negative, indicating that the fetus is female.  Testing for sex chromosome aneuploidy was not performed.  We reviewed that this testing identifies > 99% of pregnancies with trisomy 21, >98% with trisomy 18, and ~80% with trisomy 55.  We discussed that the false positive rate is <0.1% for all conditions.  She understands that this testing does not identify all genetic conditions, and that while highly sensitive and specific for fetal aneuploidy, cffDNA testing is not considered to be a diagnostic test.  They were counseled that cffDNA testing  provides a pregnancy specific risk for specific fetal aneuploidy.  They were counseled regarding the availability of diagnostic testing via amniocentesis.  The risks, benefits, and limitations, including the associated ~1 in 300-500 risk for spontaneous abortion, were reviewed.  After thoughtful consideration of this option, they declined.  They elected to proceed with a detailed anatomy ultrasound.  A complete detailed ultrasound was performed today.  The ultrasound report will be documented separately.   There were no visualized fetal anomalies or markers for fetal aneuploidy; however, the anatomy was not well visualized today.  Follow up ultrasound was scheduled in 4 weeks.  Gabriela Thompson was provided with written information regarding sickle cell anemia (SCA) including the carrier frequency and incidence in the Hispanic/African-American population, the availability of carrier testing and prenatal diagnosis if indicated.  In addition, we discussed that hemoglobinopathies are routinely screened for as part of the [facility] newborn screening panel.  She declined hemoglobin electrophoresis today.   Both family histories were reviewed and found to be contributory for Gabriela Thompson having a paternal nephew, through a paternal half-brother, who has autism.  We discussed that autism is part of the spectrum of conditions referred to as Autistic spectrum disorders (ASD). We discussed that ASDs are among the most common neurodevelopmental disorders, with approximately 1 in 88 children meeting criteria for ASD. Approximately 80% of individuals diagnosed are female. There is strong evidence that genetic factors play a critical role in development of ASD. There have been recent advances in identifying specific genetic causes of ASD, however, there are still many individuals for whom the etiology of the ASD is not known.  Based on what  we know about this family, the risk of recurrence for nonsyndromic ASD is expected to be slightly  increased.  We reviewed multifactorial inheritance.  They understand that at this time genetic testing is not available for ASD in most families.  The remainder of the family history was noncontributory for birth defects, intellectual disability, and known genetic conditions. Without further information regarding the provided family history, an accurate genetic risk cannot be calculated. Further genetic counseling is warranted if more information is obtained.  Gabriela Thompson denied exposure to environmental toxins or chemical agents. She denied the use of illicit substances during the pregnancy.  She reported that she smokes 1/2 ppd.  We discussed the implications and counseled regarding cessation.   Gabriela Thompson reported that she had multiple episodes of binge drinking during the first 4-5 weeks of pregnancy.  She reported that she had large quantities of alcohol prior to learning that she was pregnant at approximately 4-[redacted] weeks gestation.  She reported that she has had a couple of glasses of wine since discovering that she is pregnant.  We discussed that there is no amount of alcohol consumption determined to be safe during the pregnancy.  She was counseled that prenatal alcohol exposure can increase the risk for growth delays, small head size, heart defects, eye and facial differences, as well as behavior problems and learning disabilities. The risk of these to occur tends to increase with the amount of alcohol consumed. Gabriela Thompson was encouraged not to drink alcohol during the remainder of the pregnancy, given that consumption of alcohol during the second and third trimesters of pregnancy is associated with an increased risk for neurocognitive delays/concerns.  We discussed the option of fetal echocardiogram to further evaluate the fetal heart.  This was scheduled today.    Gabriela Thompson denied significant viral illnesses during the course of her pregnancy. Her medical and surgical histories were contributory for  CHTN and obesity.    I counseled this couple regarding the above risks and available options.  The approximate face-to-face time with the genetic counselor was 42 minutes.  Gabriela Arias, MS Certified Genetic Counselor

## 2013-04-05 ENCOUNTER — Ambulatory Visit (HOSPITAL_COMMUNITY)
Admission: RE | Admit: 2013-04-05 | Discharge: 2013-04-05 | Disposition: A | Discharge status: Home / Self Care | Payer: Medicaid Other | Source: Ambulatory Visit | Attending: Obstetrics and Gynecology | Admitting: Obstetrics and Gynecology

## 2013-04-05 VITALS — BP 134/76 | HR 106 | Wt >= 6400 oz

## 2013-04-05 DIAGNOSIS — O99330 Smoking (tobacco) complicating pregnancy, unspecified trimester: Secondary | ICD-10-CM | POA: Insufficient documentation

## 2013-04-05 DIAGNOSIS — O358XX0 Maternal care for other (suspected) fetal abnormality and damage, not applicable or unspecified: Secondary | ICD-10-CM | POA: Insufficient documentation

## 2013-04-05 DIAGNOSIS — O162 Unspecified maternal hypertension, second trimester: Secondary | ICD-10-CM

## 2013-04-05 DIAGNOSIS — Z363 Encounter for antenatal screening for malformations: Secondary | ICD-10-CM | POA: Insufficient documentation

## 2013-04-05 DIAGNOSIS — O09299 Supervision of pregnancy with other poor reproductive or obstetric history, unspecified trimester: Secondary | ICD-10-CM | POA: Insufficient documentation

## 2013-04-05 DIAGNOSIS — O10019 Pre-existing essential hypertension complicating pregnancy, unspecified trimester: Secondary | ICD-10-CM | POA: Insufficient documentation

## 2013-04-05 DIAGNOSIS — F1010 Alcohol abuse, uncomplicated: Secondary | ICD-10-CM | POA: Insufficient documentation

## 2013-04-05 DIAGNOSIS — O99340 Other mental disorders complicating pregnancy, unspecified trimester: Secondary | ICD-10-CM | POA: Insufficient documentation

## 2013-04-05 DIAGNOSIS — Z3689 Encounter for other specified antenatal screening: Secondary | ICD-10-CM

## 2013-04-05 DIAGNOSIS — O09522 Supervision of elderly multigravida, second trimester: Secondary | ICD-10-CM

## 2013-04-05 DIAGNOSIS — O09529 Supervision of elderly multigravida, unspecified trimester: Secondary | ICD-10-CM | POA: Insufficient documentation

## 2013-04-05 DIAGNOSIS — E669 Obesity, unspecified: Secondary | ICD-10-CM | POA: Insufficient documentation

## 2013-04-05 DIAGNOSIS — Z1389 Encounter for screening for other disorder: Secondary | ICD-10-CM | POA: Insufficient documentation

## 2013-04-05 DIAGNOSIS — O3410 Maternal care for benign tumor of corpus uteri, unspecified trimester: Secondary | ICD-10-CM | POA: Insufficient documentation

## 2013-04-05 NOTE — Progress Notes (Signed)
[facility] ultrasound  Indication: 37 yr old [phone] at [redacted]w[redacted]d with morbid obesity, chronic hypertension, uterine fibroid, and alcohol use in early pregnancy for follow up ultrasound to complete anatomy.  Findings: 1. Single intrauterine pregnancy. 2. Estimated fetal weight is in the 51st%. 3. Anterior placenta without evidence of previa. 4. Normal amniotic fluid volume. 5. The views of the left outflow tract and spine continue to be limited. 6. The remainder of the limited anatomy survey is normal. Any anatomy not evaluated on today's exam was evaluated on the previous exam.  Recommendations: 1. Appropriate fetal growth. 2. Limited anatomy survey: - recommend follow up in 4 weeks to reattempt anatomy - had fetal echocardiogram today; results pending 3. Hypertension: - previously counseled - on aldomet - recommend close surveillance for the development of signs/symptoms of preeclampsia - recommend fetal growth every 4 weeks - recommend antenatal testing starting at [redacted] weeks gestation - recommend delivery by estimated due date but not prior to 38 weeks in the absence of other complications 4. Obesity: - recommend surveillance as above 5. Alcohol use early in pregnancy (prior to knowing she was pregnant): - previously counseled - had fetal echocardiogram 6. Uterine fibroid: - recommend fetal growth as above - reevaluate fibroid on follow up ultrasounds 7. Advanced maternal age: - previously counseled - had normal cell free fetal DNA  Eulis Foster, MD

## 2013-05-03 ENCOUNTER — Ambulatory Visit (HOSPITAL_COMMUNITY): Payer: Medicaid Other

## 2013-05-08 ENCOUNTER — Ambulatory Visit (HOSPITAL_COMMUNITY)
Admission: RE | Admit: 2013-05-08 | Discharge: 2013-05-08 | Disposition: A | Discharge status: Home / Self Care | Payer: Medicaid Other | Source: Ambulatory Visit | Attending: Obstetrics and Gynecology | Admitting: Obstetrics and Gynecology

## 2013-05-08 ENCOUNTER — Other Ambulatory Visit: Payer: Self-pay

## 2013-05-08 ENCOUNTER — Encounter (HOSPITAL_COMMUNITY): Payer: Self-pay

## 2013-05-08 DIAGNOSIS — F1010 Alcohol abuse, uncomplicated: Secondary | ICD-10-CM | POA: Insufficient documentation

## 2013-05-08 DIAGNOSIS — O3410 Maternal care for benign tumor of corpus uteri, unspecified trimester: Secondary | ICD-10-CM | POA: Insufficient documentation

## 2013-05-08 DIAGNOSIS — O09529 Supervision of elderly multigravida, unspecified trimester: Secondary | ICD-10-CM | POA: Insufficient documentation

## 2013-05-08 DIAGNOSIS — O99330 Smoking (tobacco) complicating pregnancy, unspecified trimester: Secondary | ICD-10-CM | POA: Insufficient documentation

## 2013-05-08 DIAGNOSIS — O10019 Pre-existing essential hypertension complicating pregnancy, unspecified trimester: Secondary | ICD-10-CM | POA: Insufficient documentation

## 2013-05-08 DIAGNOSIS — O99340 Other mental disorders complicating pregnancy, unspecified trimester: Secondary | ICD-10-CM | POA: Insufficient documentation

## 2013-05-08 DIAGNOSIS — O162 Unspecified maternal hypertension, second trimester: Secondary | ICD-10-CM

## 2013-05-08 DIAGNOSIS — E669 Obesity, unspecified: Secondary | ICD-10-CM | POA: Insufficient documentation

## 2013-05-08 DIAGNOSIS — O09299 Supervision of pregnancy with other poor reproductive or obstetric history, unspecified trimester: Secondary | ICD-10-CM | POA: Insufficient documentation

## 2013-05-09 LAB — TOXOPLASMA ANTIBODIES- IGG AND  IGM
Toxoplasma Antibody- IgM: <3 IU/mL (ref <8.0)
Toxoplasma IgG Ratio: <3 IU/mL (ref <7.2)

## 2013-05-09 LAB — CMV ANTIBODY, IGG (EIA): CMV Ab - IgG: >10 U/mL — ABNORMAL HIGH (ref <0.60)

## 2013-05-10 LAB — PARVOVIRUS B19 ANTIBODY, IGG AND IGM: Parovirus B19 IgG Abs: 5.1 index — ABNORMAL HIGH (ref <0.9)

## 2013-05-23 ENCOUNTER — Telehealth (HOSPITAL_COMMUNITY): Payer: Self-pay | Admitting: MS"

## 2013-05-23 NOTE — Telephone Encounter (Signed)
Called Ms. Gabriela Thompson regarding results of her cystic fibrosis carrier screen, which was offered given the previous ultrasound finding of fetal echogenic bowel. Patient was identified by name and DOB. Carrier screening for cystic fibrosis yielded a normal/negative result for the 97 most common disease-causing mutations, meaning that the risk to be a CF carrier can be reduced from 1 in 61 to approximately 1 in 316 (0.3%).  Therefore, the risk for cystic fibrosis in her current pregnancy is significantly reduced. The patient understands that CF carrier screening can reduce but not eliminate the chance to be a CF carrier.   Additionally, discussed with [name] that results of TORCH titers drawn at her previous visit are within normal limits per discussion with our nurse.   [name] 05/23/2013 2:40 PM

## 2013-06-02 ENCOUNTER — Other Ambulatory Visit (HOSPITAL_COMMUNITY): Payer: Self-pay | Admitting: Obstetrics and Gynecology

## 2013-06-02 DIAGNOSIS — O10019 Pre-existing essential hypertension complicating pregnancy, unspecified trimester: Secondary | ICD-10-CM

## 2013-06-02 DIAGNOSIS — O09529 Supervision of elderly multigravida, unspecified trimester: Secondary | ICD-10-CM

## 2013-06-02 DIAGNOSIS — E669 Obesity, unspecified: Secondary | ICD-10-CM

## 2013-06-05 ENCOUNTER — Ambulatory Visit (HOSPITAL_COMMUNITY)
Admission: RE | Admit: 2013-06-05 | Discharge: 2013-06-05 | Disposition: A | Discharge status: Home / Self Care | Payer: Medicaid Other | Source: Ambulatory Visit | Attending: Obstetrics and Gynecology | Admitting: Obstetrics and Gynecology

## 2013-06-05 DIAGNOSIS — O99810 Abnormal glucose complicating pregnancy: Secondary | ICD-10-CM | POA: Insufficient documentation

## 2013-06-05 DIAGNOSIS — O99330 Smoking (tobacco) complicating pregnancy, unspecified trimester: Secondary | ICD-10-CM | POA: Insufficient documentation

## 2013-06-05 DIAGNOSIS — O09529 Supervision of elderly multigravida, unspecified trimester: Secondary | ICD-10-CM | POA: Insufficient documentation

## 2013-06-05 DIAGNOSIS — E669 Obesity, unspecified: Secondary | ICD-10-CM | POA: Insufficient documentation

## 2013-06-05 DIAGNOSIS — O3410 Maternal care for benign tumor of corpus uteri, unspecified trimester: Secondary | ICD-10-CM | POA: Insufficient documentation

## 2013-06-05 DIAGNOSIS — O09299 Supervision of pregnancy with other poor reproductive or obstetric history, unspecified trimester: Secondary | ICD-10-CM | POA: Insufficient documentation

## 2013-06-05 DIAGNOSIS — O10019 Pre-existing essential hypertension complicating pregnancy, unspecified trimester: Secondary | ICD-10-CM | POA: Insufficient documentation

## 2013-06-30 ENCOUNTER — Other Ambulatory Visit (HOSPITAL_COMMUNITY): Payer: Self-pay | Admitting: Obstetrics and Gynecology

## 2013-06-30 DIAGNOSIS — O24919 Unspecified diabetes mellitus in pregnancy, unspecified trimester: Secondary | ICD-10-CM

## 2013-06-30 DIAGNOSIS — O09529 Supervision of elderly multigravida, unspecified trimester: Secondary | ICD-10-CM

## 2013-07-03 ENCOUNTER — Ambulatory Visit (HOSPITAL_COMMUNITY)
Admission: RE | Admit: 2013-07-03 | Discharge: 2013-07-03 | Disposition: A | Discharge status: Home / Self Care | Payer: Medicaid Other | Source: Ambulatory Visit | Attending: Obstetrics and Gynecology | Admitting: Obstetrics and Gynecology

## 2013-07-03 VITALS — BP 127/80 | HR 110 | Wt 393.0 lb

## 2013-07-03 DIAGNOSIS — O24919 Unspecified diabetes mellitus in pregnancy, unspecified trimester: Secondary | ICD-10-CM

## 2013-07-03 DIAGNOSIS — O09299 Supervision of pregnancy with other poor reproductive or obstetric history, unspecified trimester: Secondary | ICD-10-CM | POA: Insufficient documentation

## 2013-07-03 DIAGNOSIS — E669 Obesity, unspecified: Secondary | ICD-10-CM | POA: Insufficient documentation

## 2013-07-03 DIAGNOSIS — O10019 Pre-existing essential hypertension complicating pregnancy, unspecified trimester: Secondary | ICD-10-CM | POA: Insufficient documentation

## 2013-07-03 DIAGNOSIS — O99340 Other mental disorders complicating pregnancy, unspecified trimester: Secondary | ICD-10-CM | POA: Insufficient documentation

## 2013-07-03 DIAGNOSIS — F1010 Alcohol abuse, uncomplicated: Secondary | ICD-10-CM | POA: Insufficient documentation

## 2013-07-03 DIAGNOSIS — O3410 Maternal care for benign tumor of corpus uteri, unspecified trimester: Secondary | ICD-10-CM | POA: Insufficient documentation

## 2013-07-03 DIAGNOSIS — O09529 Supervision of elderly multigravida, unspecified trimester: Secondary | ICD-10-CM | POA: Insufficient documentation

## 2013-07-03 DIAGNOSIS — O99330 Smoking (tobacco) complicating pregnancy, unspecified trimester: Secondary | ICD-10-CM | POA: Insufficient documentation

## 2013-07-03 DIAGNOSIS — O99810 Abnormal glucose complicating pregnancy: Secondary | ICD-10-CM | POA: Insufficient documentation

## 2013-07-03 NOTE — Progress Notes (Signed)
Gabriela Thompson  was seen today for an ultrasound appointment.  See full report in [facility].  Impression: Single IUP a 34 5/7 weeks A2 GDM, CHTN, advanced maternal age with low risk NIPS Interval growth is appropriate (69th %tile) Normal amniotic fluid volume  Recommendations: Recommend follow up in 3 weeks for interval growth. Continue 2x weekly NSTs Delivery at 39 weeks if fetal testing is otherwise reassuring.  Alpha Gula, MD

## 2013-07-04 NOTE — Addendum Note (Signed)
Encounter addended by: Rochel Privett M. Ajiah Mcglinn, RN on: 07/04/2013 11:38 AM<BR>     Documentation filed: Charges VN

## 2013-07-28 ENCOUNTER — Ambulatory Visit (HOSPITAL_COMMUNITY)
Admission: RE | Admit: 2013-07-28 | Discharge: 2013-07-28 | Disposition: A | Discharge status: Home / Self Care | Payer: Medicaid Other | Source: Ambulatory Visit | Attending: Obstetrics and Gynecology | Admitting: Obstetrics and Gynecology

## 2013-07-28 DIAGNOSIS — O09529 Supervision of elderly multigravida, unspecified trimester: Secondary | ICD-10-CM | POA: Insufficient documentation

## 2013-07-28 DIAGNOSIS — O99330 Smoking (tobacco) complicating pregnancy, unspecified trimester: Secondary | ICD-10-CM | POA: Insufficient documentation

## 2013-07-28 DIAGNOSIS — O3410 Maternal care for benign tumor of corpus uteri, unspecified trimester: Secondary | ICD-10-CM | POA: Insufficient documentation

## 2013-07-28 DIAGNOSIS — O24919 Unspecified diabetes mellitus in pregnancy, unspecified trimester: Secondary | ICD-10-CM

## 2013-07-28 DIAGNOSIS — O10019 Pre-existing essential hypertension complicating pregnancy, unspecified trimester: Secondary | ICD-10-CM | POA: Insufficient documentation

## 2013-07-28 DIAGNOSIS — O99810 Abnormal glucose complicating pregnancy: Secondary | ICD-10-CM | POA: Insufficient documentation

## 2013-07-28 DIAGNOSIS — O99340 Other mental disorders complicating pregnancy, unspecified trimester: Secondary | ICD-10-CM | POA: Insufficient documentation

## 2013-07-28 DIAGNOSIS — F1010 Alcohol abuse, uncomplicated: Secondary | ICD-10-CM | POA: Insufficient documentation

## 2013-07-28 DIAGNOSIS — E669 Obesity, unspecified: Secondary | ICD-10-CM | POA: Insufficient documentation

## 2014-01-07 ENCOUNTER — Emergency Department (HOSPITAL_BASED_OUTPATIENT_CLINIC_OR_DEPARTMENT_OTHER)
Admission: EM | Admit: 2014-01-07 | Discharge: 2014-01-07 | Disposition: A | Discharge status: Home / Self Care | Payer: Medicaid Other | Attending: Emergency Medicine | Admitting: Emergency Medicine

## 2014-01-07 ENCOUNTER — Encounter (HOSPITAL_BASED_OUTPATIENT_CLINIC_OR_DEPARTMENT_OTHER): Payer: Self-pay | Admitting: Emergency Medicine

## 2014-01-07 DIAGNOSIS — I509 Heart failure, unspecified: Secondary | ICD-10-CM | POA: Insufficient documentation

## 2014-01-07 DIAGNOSIS — M545 Low back pain, unspecified: Secondary | ICD-10-CM | POA: Insufficient documentation

## 2014-01-07 DIAGNOSIS — I10 Essential (primary) hypertension: Secondary | ICD-10-CM | POA: Insufficient documentation

## 2014-01-07 DIAGNOSIS — Z79899 Other long term (current) drug therapy: Secondary | ICD-10-CM | POA: Insufficient documentation

## 2014-01-07 DIAGNOSIS — Z7982 Long term (current) use of aspirin: Secondary | ICD-10-CM | POA: Insufficient documentation

## 2014-01-07 DIAGNOSIS — F17200 Nicotine dependence, unspecified, uncomplicated: Secondary | ICD-10-CM | POA: Insufficient documentation

## 2014-01-07 HISTORY — DX: Heart failure, unspecified: I50.9

## 2014-01-07 HISTORY — DX: Endocarditis, valve unspecified: I38

## 2014-01-07 LAB — URINALYSIS, ROUTINE W REFLEX MICROSCOPIC
Bilirubin Urine: NEGATIVE
Glucose, UA: NEGATIVE mg/dL
Hgb urine dipstick: NEGATIVE
Ketones, ur: NEGATIVE mg/dL
LEUKOCYTES UA: NEGATIVE
NITRITE: NEGATIVE
PH: 6 (ref 5.0–8.0)
Protein, ur: NEGATIVE mg/dL
SPECIFIC GRAVITY, URINE: 1.021 (ref 1.005–1.030)
UROBILINOGEN UA: 0.2 mg/dL (ref 0.0–1.0)

## 2014-01-07 LAB — PREGNANCY, URINE: Preg Test, Ur: NEGATIVE

## 2014-01-07 MED ORDER — CYCLOBENZAPRINE HCL 10 MG PO TABS: 10.0000 mg | ORAL_TABLET | Freq: Three times a day (TID) | ORAL | Status: DC

## 2014-01-07 MED ORDER — CYCLOBENZAPRINE HCL 10 MG PO TABS
10.0000 mg | ORAL_TABLET | Freq: Once | ORAL | Status: AC
  Administered 2014-01-07: 10 mg via ORAL
  Filled 2014-01-07: qty 1

## 2014-01-07 MED ORDER — HYDROCODONE-ACETAMINOPHEN 5-325 MG PO TABS
1.0000 | ORAL_TABLET | Freq: Once | ORAL | Status: AC
  Administered 2014-01-07: 1 via ORAL
  Filled 2014-01-07: qty 1

## 2014-01-07 MED ORDER — IBUPROFEN 800 MG PO TABS: 800.0000 mg | ORAL_TABLET | Freq: Three times a day (TID) | ORAL | Status: DC

## 2014-01-07 MED ORDER — HYDROCODONE-ACETAMINOPHEN 5-325 MG PO TABS: 1.0000 | ORAL_TABLET | ORAL | Status: DC | PRN

## 2014-01-07 MED ORDER — IBUPROFEN 800 MG PO TABS
800.0000 mg | ORAL_TABLET | Freq: Once | ORAL | Status: AC
  Administered 2014-01-07: 800 mg via ORAL
  Filled 2014-01-07: qty 1

## 2014-01-07 NOTE — ED Provider Notes (Signed)
CSN: [id number]     Arrival date & time 01/07/14  1318 History   First MD Initiated Contact with Patient 01/07/14 1324     Chief Complaint  Patient presents with  . Back Pain     (Consider location/radiation/quality/duration/timing/severity/associated sxs/prior Treatment) Patient is a 38 y.o. female presenting with back pain. The history is provided by the patient. No language interpreter was used.  Back Pain Location:  Lumbar spine Quality:  Aching, cramping and stabbing Pain severity:  Moderate Associated symptoms: no fever, no headaches and no numbness   Associated symptoms comment:  Lower right back pain for the past 3 days that is sometimes sharp, shooting pain and sometimes a dull ache. No known injury. No urinary or bowel incontinence. No history of chronic back pain.   Past Medical History  Diagnosis Date  . Hypertension   . CHF (congestive heart failure)   . Heart valve problem    History reviewed. No pertinent past surgical history. No family history on file. History  Substance Use Topics  . Smoking status: Current Every Day Smoker -- 1.50 packs/day    Types: Cigarettes  . Smokeless tobacco: Not on file  . Alcohol Use: Yes     Comment: social   OB History   Grav Para Term Preterm Abortions TAB SAB Ect Mult Living   6 3 3  0 2 2 0 0 0 3     Review of Systems  Constitutional: Negative for fever and chills.  Gastrointestinal: Negative.  Negative for nausea.  Musculoskeletal: Positive for back pain.  Skin: Negative.   Neurological: Negative.  Negative for numbness and headaches.      Allergies  Review of patient's allergies indicates no known allergies.  Home Medications   Prior to Admission medications   Medication Sig Start Date End Date Taking? Authorizing Provider  amLODipine (NORVASC) 10 MG tablet Take 10 mg by mouth daily.   Yes Historical Provider, MD  aspirin 81 MG tablet Take 81 mg by mouth daily.   Yes Historical Provider, MD  furosemide  (LASIX) 20 MG tablet Take 20 mg by mouth.   Yes Historical Provider, MD  lisinopril (PRINIVIL,ZESTRIL) 20 MG tablet Take 20 mg by mouth daily.   Yes Historical Provider, MD  metoprolol succinate (TOPROL-XL) 100 MG 24 hr tablet Take 100 mg by mouth daily. Take with or immediately following a meal.   Yes Historical Provider, MD  docusate sodium (COLACE) 100 MG capsule Take 1 capsule (100 mg total) by mouth every 12 (twelve) hours. 12/12/12   Glynn OctaveStephen Rancour, MD  hydrochlorothiazide (HYDRODIURIL) 25 MG tablet Take 1 tablet (25 mg total) by mouth daily. 07/12/12   Gwyneth SproutWhitney Plunkett, MD  hydrocortisone (ANUSOL-HC) 2.5 % rectal cream Apply rectally 2 times daily 12/12/12   Glynn OctaveStephen Rancour, MD  hydrOXYzine (ATARAX/VISTARIL) 25 MG tablet Take 1 tablet (25 mg total) by mouth every 6 (six) hours. 08/16/12   Elson AreasLeslie K Sofia, PA-C  LORazepam (ATIVAN) 1 MG tablet Take 1 tablet (1 mg total) by mouth every 8 (eight) hours. 08/16/12   Elson AreasLeslie K Sofia, PA-C  methyldopa (ALDOMET) 500 MG tablet Take 500 mg by mouth 3 (three) times daily.    Historical Provider, MD  metroNIDAZOLE (FLAGYL) 500 MG tablet Take 1 tablet (500 mg total) by mouth 2 (two) times daily. 12/12/12   Glynn OctaveStephen Rancour, MD  ondansetron (ZOFRAN) 4 MG tablet Take 1 tablet (4 mg total) by mouth every 6 (six) hours. 12/12/12   Glynn OctaveStephen Rancour, MD  ondansetron (ZOFRAN-ODT)  4 MG disintegrating tablet Take 4 mg by mouth every 8 (eight) hours as needed for nausea.    Historical Provider, MD  permethrin (ELIMITE) 5 % cream Apply to affected area once. Go to bed at night after you've apply the cream to wash off in the morning. If you're still itching in one week reapply a second time 07/25/12   Elson AreasLeslie K Sofia, PA-C  permethrin Verner Mould(ELIMITE) 5 % cream Apply to affected area once 08/16/12   Elson AreasLeslie K Sofia, PA-C  Prenatal Vit-Fe Fumarate-FA (PRENATAL MULTIVITAMIN) TABS Take 1 tablet by mouth daily at 12 noon.    Historical Provider, MD   BP 178/90  Pulse 92  Temp(Src) 99.1  F (37.3 C) (Oral)  Resp 20  Ht 5\' 11"  (1.803 m)  Wt 370 lb (167.831 kg)  BMI 51.63 kg/m2  SpO2 98%  LMP 12/26/2013  Breastfeeding? Unknown Physical Exam  Constitutional: She is oriented to person, place, and time. She appears well-developed and well-nourished.  HENT:  Head: Normocephalic.  Neck: Normal range of motion. Neck supple.  Cardiovascular: Normal rate and regular rhythm.   Pulmonary/Chest: Effort normal and breath sounds normal.  Abdominal: Soft. Bowel sounds are normal. There is no tenderness. There is no rebound and no guarding.  Musculoskeletal: Normal range of motion.  Right lumbar and paralumbar tenderness without swelling or discoloration.  Neurological: She is alert and oriented to person, place, and time. She has normal reflexes. Coordination normal.  Skin: Skin is warm and dry. No rash noted.  Psychiatric: She has a normal mood and affect.    ED Course  Procedures (including critical care time) Labs Review Labs Reviewed  URINALYSIS, ROUTINE W REFLEX MICROSCOPIC  PREGNANCY, URINE    Imaging Review No results found.   EKG Interpretation None      MDM   Final diagnoses:  None    1. Low back pain  Uncomplicated muscular low back pain without neurologic deficit.    Arnoldo HookerShari A Darlyne Schmiesing, PA-C 01/07/14 1355

## 2014-01-07 NOTE — ED Provider Notes (Signed)
History/physical exam/procedure(s) were performed by non-physician practitioner and as supervising physician I was immediately available for consultation/collaboration. I have reviewed all notes and am in agreement with care and plan.   Niley Helbig S Jasin Brazel, MD 01/07/14 1509 

## 2014-01-07 NOTE — Discharge Instructions (Signed)
Muscle Cramps and Spasms Muscle cramps and spasms occur when a muscle or muscles tighten and you have no control over this tightening (involuntary muscle contraction). They are a common problem and can develop in any muscle. The most common place is in the calf muscles of the leg. Both muscle cramps and muscle spasms are involuntary muscle contractions, but they also have differences:   Muscle cramps are sporadic and painful. They may last a few seconds to a quarter of an hour. Muscle cramps are often more forceful and last longer than muscle spasms.  Muscle spasms may or may not be painful. They may also last just a few seconds or much longer. CAUSES  It is uncommon for cramps or spasms to be due to a serious underlying problem. In many cases, the cause of cramps or spasms is unknown. Some common causes are:   Overexertion.   Overuse from repetitive motions (doing the same thing over and over).   Remaining in a certain position for a long period of time.   Improper preparation, form, or technique while performing a sport or activity.   Dehydration.   Injury.   Side effects of some medicines.   Abnormally low levels of the salts and ions in your blood (electrolytes), especially potassium and calcium. This could happen if you are taking water pills (diuretics) or you are pregnant.  Some underlying medical problems can make it more likely to develop cramps or spasms. These include, but are not limited to:   Diabetes.   Parkinson disease.   Hormone disorders, such as thyroid problems.   Alcohol abuse.   Diseases specific to muscles, joints, and bones.   Blood vessel disease where not enough blood is getting to the muscles.  HOME CARE INSTRUCTIONS   Stay well hydrated. Drink enough water and fluids to keep your urine clear or pale yellow.  It may be helpful to massage, stretch, and relax the affected muscle.  For tight or tense muscles, use a warm towel, heating  pad, or hot shower water directed to the affected area.  If you are sore or have pain after a cramp or spasm, applying ice to the affected area may relieve discomfort.  Put ice in a plastic bag.  Place a towel between your skin and the bag.  Leave the ice on for 15-20 minutes, 03-04 times a day.  Medicines used to treat a known cause of cramps or spasms may help reduce their frequency or severity. Only take over-the-counter or prescription medicines as directed by your caregiver. SEEK MEDICAL CARE IF:  Your cramps or spasms get more severe, more frequent, or do not improve over time.  MAKE SURE YOU:   Understand these instructions.  Will watch your condition.  Will get help right away if you are not doing well or get worse. Document Released: 01/30/2002 Document Revised: 12/05/2012 Document Reviewed: 07/27/2012 [facility] Patient Information 2014 [place], [place]. Back Pain, Adult Low back pain is very common. About 1 in 5 people have back pain.The cause of low back pain is rarely dangerous. The pain often gets better over time.About half of people with a sudden onset of back pain feel better in just 2 weeks. About 8 in 10 people feel better by 6 weeks.  CAUSES Some common causes of back pain include:  Strain of the muscles or ligaments supporting the spine.  Wear and tear (degeneration) of the spinal discs.  Arthritis.  Direct injury to the back. DIAGNOSIS Most of the time,  the direct cause of low back pain is not known.However, back pain can be treated effectively even when the exact cause of the pain is unknown.Answering your caregiver's questions about your overall health and symptoms is one of the most accurate ways to make sure the cause of your pain is not dangerous. If your caregiver needs more information, he or she may order lab work or imaging tests (X-rays or MRIs).However, even if imaging tests show changes in your back, this usually does not require surgery. HOME  CARE INSTRUCTIONS For many people, back pain returns.Since low back pain is rarely dangerous, it is often a condition that people can learn to [facility]manageon their own.   Remain active. It is stressful on the back to sit or stand in one place. Do not sit, drive, or stand in one place for more than 30 minutes at a time. Take short walks on level surfaces as soon as pain allows.Try to increase the length of time you walk each day.  Do not stay in bed.Resting more than 1 or 2 days can delay your recovery.  Do not avoid exercise or work.Your body is made to move.It is not dangerous to be active, even though your back may hurt.Your back will likely heal faster if you return to being active before your pain is gone.  Pay attention to your body when you bend and lift. Many people have less discomfortwhen lifting if they bend their knees, keep the load close to their bodies,and avoid twisting. Often, the most comfortable positions are those that put less stress on your recovering back.  Find a comfortable position to sleep. Use a firm mattress and lie on your side with your knees slightly bent. If you lie on your back, put a pillow under your knees.  Only take over-the-counter or prescription medicines as directed by your caregiver. Over-the-counter medicines to reduce pain and inflammation are often the most helpful.Your caregiver may prescribe muscle relaxant drugs.These medicines help dull your pain so you can more quickly return to your normal activities and healthy exercise.  Put ice on the injured area.  Put ice in a plastic bag.  Place a towel between your skin and the bag.  Leave the ice on for 15-20 minutes, 03-04 times a day for the first 2 to 3 days. After that, ice and heat may be alternated to reduce pain and spasms.  Ask your caregiver about trying back exercises and gentle massage. This may be of some benefit.  Avoid feeling anxious or stressed.Stress increases muscle tension  and can worsen back pain.It is important to recognize when you are anxious or stressed and learn ways to manage it.Exercise is a great option. SEEK MEDICAL CARE IF:  You have pain that is not relieved with rest or medicine.  You have pain that does not improve in 1 week.  You have new symptoms.  You are generally not feeling well. SEEK IMMEDIATE MEDICAL CARE IF:   You have pain that radiates from your back into your legs.  You develop new bowel or bladder control problems.  You have unusual weakness or numbness in your arms or legs.  You develop nausea or vomiting.  You develop abdominal pain.  You feel faint. Document Released: 08/10/2005 Document Revised: 02/09/2012 Document Reviewed: 12/29/2010 [facility] Patient Information 2014 [place], [place].

## 2014-01-07 NOTE — ED Notes (Signed)
C/o lower back and lower abd pain since Friday. Denies fever, denies N/V/D, Denies urinary symptoms

## 2014-04-14 ENCOUNTER — Emergency Department (HOSPITAL_BASED_OUTPATIENT_CLINIC_OR_DEPARTMENT_OTHER)
Admission: EM | Admit: 2014-04-14 | Discharge: 2014-04-14 | Disposition: A | Discharge status: Home / Self Care | Payer: Medicaid Other | Attending: Emergency Medicine | Admitting: Emergency Medicine

## 2014-04-14 ENCOUNTER — Encounter (HOSPITAL_BASED_OUTPATIENT_CLINIC_OR_DEPARTMENT_OTHER): Payer: Self-pay | Admitting: Emergency Medicine

## 2014-04-14 DIAGNOSIS — F17200 Nicotine dependence, unspecified, uncomplicated: Secondary | ICD-10-CM | POA: Diagnosis not present

## 2014-04-14 DIAGNOSIS — I509 Heart failure, unspecified: Secondary | ICD-10-CM | POA: Diagnosis not present

## 2014-04-14 DIAGNOSIS — I10 Essential (primary) hypertension: Secondary | ICD-10-CM | POA: Insufficient documentation

## 2014-04-14 DIAGNOSIS — R748 Abnormal levels of other serum enzymes: Secondary | ICD-10-CM | POA: Insufficient documentation

## 2014-04-14 DIAGNOSIS — M79604 Pain in right leg: Secondary | ICD-10-CM

## 2014-04-14 DIAGNOSIS — Z862 Personal history of diseases of the blood and blood-forming organs and certain disorders involving the immune mechanism: Secondary | ICD-10-CM | POA: Diagnosis not present

## 2014-04-14 DIAGNOSIS — Z79899 Other long term (current) drug therapy: Secondary | ICD-10-CM | POA: Insufficient documentation

## 2014-04-14 DIAGNOSIS — M79609 Pain in unspecified limb: Secondary | ICD-10-CM | POA: Insufficient documentation

## 2014-04-14 DIAGNOSIS — Z8639 Personal history of other endocrine, nutritional and metabolic disease: Secondary | ICD-10-CM | POA: Insufficient documentation

## 2014-04-14 DIAGNOSIS — M79605 Pain in left leg: Secondary | ICD-10-CM

## 2014-04-14 DIAGNOSIS — Z7982 Long term (current) use of aspirin: Secondary | ICD-10-CM | POA: Diagnosis not present

## 2014-04-14 DIAGNOSIS — IMO0002 Reserved for concepts with insufficient information to code with codable children: Secondary | ICD-10-CM | POA: Diagnosis not present

## 2014-04-14 HISTORY — DX: Pure hypercholesterolemia, unspecified: E78.00

## 2014-04-14 LAB — BASIC METABOLIC PANEL
Anion gap: 11 (ref 5–15)
BUN: 12 mg/dL (ref 6–23)
CHLORIDE: 100 meq/L (ref 96–112)
CO2: 26 mEq/L (ref 19–32)
CREATININE: 0.8 mg/dL (ref 0.50–1.10)
Calcium: 9.8 mg/dL (ref 8.4–10.5)
GFR calc Af Amer: >90 mL/min (ref >90)
GFR calc non Af Amer: >90 mL/min (ref >90)
Glucose, Bld: 104 mg/dL — ABNORMAL HIGH (ref 70–99)
Potassium: 3.9 mEq/L (ref 3.7–5.3)
Sodium: 137 mEq/L (ref 137–147)

## 2014-04-14 LAB — URINALYSIS, ROUTINE W REFLEX MICROSCOPIC
Bilirubin Urine: NEGATIVE
GLUCOSE, UA: NEGATIVE mg/dL
HGB URINE DIPSTICK: NEGATIVE
Ketones, ur: NEGATIVE mg/dL
Leukocytes, UA: NEGATIVE
Nitrite: NEGATIVE
PH: 5.5 (ref 5.0–8.0)
Protein, ur: NEGATIVE mg/dL
SPECIFIC GRAVITY, URINE: 1.026 (ref 1.005–1.030)
Urobilinogen, UA: 0.2 mg/dL (ref 0.0–1.0)

## 2014-04-14 LAB — CK: Total CK: 646 U/L — ABNORMAL HIGH (ref 7–177)

## 2014-04-14 MED ORDER — SODIUM CHLORIDE 0.9 % IV BOLUS (SEPSIS)
1000.0000 mL | Freq: Once | INTRAVENOUS | Status: AC
  Administered 2014-04-14: 1000 mL via INTRAVENOUS

## 2014-04-14 MED ORDER — HYDROMORPHONE HCL PF 1 MG/ML IJ SOLN
1.0000 mg | Freq: Once | INTRAMUSCULAR | Status: AC
  Administered 2014-04-14: 1 mg via INTRAVENOUS
  Filled 2014-04-14: qty 1

## 2014-04-14 MED ORDER — HYDROCODONE-ACETAMINOPHEN 5-325 MG PO TABS: 1.0000 | ORAL_TABLET | Freq: Four times a day (QID) | ORAL | Status: DC | PRN

## 2014-04-14 MED ORDER — SODIUM CHLORIDE 0.9 % IV BOLUS (SEPSIS): 1000.0000 mL | Freq: Once | INTRAVENOUS | Status: AC

## 2014-04-14 NOTE — ED Provider Notes (Signed)
CSN: [id number]     Arrival date & time 04/14/14  2004 History  This chart was scribed for Gerhard Munch, MD, by Yevette Edwards, ED Scribe. This patient was seen in room MH12/MH12 and the patient's care was started at 8:20 PM.   First MD Initiated Contact with Patient 04/14/14 2015     Chief Complaint  Patient presents with  . Leg Pain    The history is provided by the patient. No language interpreter was used.   HPI Comments: Gabriela Thompson is a 38 y.o. female, with a h/o HTN and CHF, who presents to the Emergency Department complaining of bilateral leg pain, left greater than right, which has persisted for a month. Gabriela Thompson rates the pain as 10/10, and she reports the pain affects her thigh down on her left leg and the knee down on her right. She also endorses minor pains to her arms bilaterally. She reports she began a new job last month which requires her to stand on a concrete floor, and the leg pain began after started the job. Gabriela Thompson was treated by her PCP two weeks ago for similar symptoms; her PCP prescribed her IBU 800 mg which she uses with moderate relief. She was also treated at an ED last week where lab work indicated the IBU may be interacting with one of her cholesterol medications to aggravate the leg pain.  She denies chest pain, dyspnea, abdominal pain, emesis, or a fever. She also denies any lower extremity swelling increased from baseline. Additionally, she denies a h/o blood clots, though she endorses a family h/o blood clots. She does not use oral contraceptives.Gabriela Thompson is a current smoker and drinks alcohol.   Past Medical History  Diagnosis Date  . Hypertension   . CHF (congestive heart failure)   . Heart valve problem   . High cholesterol    History reviewed. No pertinent past surgical history. No family history on file. History  Substance Use Topics  . Smoking status: Current Every Day Smoker -- 1.50 packs/day    Types: Cigarettes  . Smokeless tobacco:  Not on file  . Alcohol Use: Yes     Comment: social   OB History   Grav Para Term Preterm Abortions TAB SAB Ect Mult Living   6 3 3  0 2 2 0 0 0 3     Review of Systems  Constitutional: Negative for fever.       Per HPI, otherwise negative  HENT:       Per HPI, otherwise negative  Respiratory: Negative for shortness of breath.        Per HPI, otherwise negative  Cardiovascular: Positive for leg swelling (Baseline). Negative for chest pain.       Per HPI, otherwise negative  Gastrointestinal: Negative for vomiting and abdominal pain.  Endocrine:       Negative aside from HPI  Genitourinary:       Neg aside from HPI   Musculoskeletal: Positive for myalgias.       Per HPI, otherwise negative  Skin: Negative.   Neurological: Negative for syncope.    Allergies  Review of patient's allergies indicates no known allergies.  Home Medications   Prior to Admission medications   Medication Sig Start Date End Date Taking? Authorizing Provider  amLODipine (NORVASC) 10 MG tablet Take 10 mg by mouth daily.    Historical Provider, MD  aspirin 81 MG tablet Take 81 mg by mouth daily.    Historical Provider, MD  cyclobenzaprine (FLEXERIL) 10 MG tablet Take 1 tablet (10 mg total) by mouth 3 (three) times daily. 01/07/14   Shari A Upstill, PA-C  docusate sodium (COLACE) 100 MG capsule Take 1 capsule (100 mg total) by mouth every 12 (twelve) hours. 12/12/12   Glynn Octave, MD  furosemide (LASIX) 20 MG tablet Take 20 mg by mouth.    Historical Provider, MD  hydrochlorothiazide (HYDRODIURIL) 25 MG tablet Take 1 tablet (25 mg total) by mouth daily. 07/12/12   Gwyneth Sprout, MD  HYDROcodone-acetaminophen (NORCO/VICODIN) 5-325 MG per tablet Take 1-2 tablets by mouth every 4 (four) hours as needed for moderate pain. 01/07/14   Shari A Upstill, PA-C  hydrocortisone (ANUSOL-HC) 2.5 % rectal cream Apply rectally 2 times daily 12/12/12   Glynn Octave, MD  hydrOXYzine (ATARAX/VISTARIL) 25 MG tablet  Take 1 tablet (25 mg total) by mouth every 6 (six) hours. 08/16/12   Elson Areas, PA-C  ibuprofen (ADVIL,MOTRIN) 800 MG tablet Take 1 tablet (800 mg total) by mouth 3 (three) times daily. 01/07/14   Shari A Upstill, PA-C  lisinopril (PRINIVIL,ZESTRIL) 20 MG tablet Take 20 mg by mouth daily.    Historical Provider, MD  LORazepam (ATIVAN) 1 MG tablet Take 1 tablet (1 mg total) by mouth every 8 (eight) hours. 08/16/12   Elson Areas, PA-C  methyldopa (ALDOMET) 500 MG tablet Take 500 mg by mouth 3 (three) times daily.    Historical Provider, MD  metoprolol succinate (TOPROL-XL) 100 MG 24 hr tablet Take 100 mg by mouth daily. Take with or immediately following a meal.    Historical Provider, MD  metroNIDAZOLE (FLAGYL) 500 MG tablet Take 1 tablet (500 mg total) by mouth 2 (two) times daily. 12/12/12   Glynn Octave, MD  ondansetron (ZOFRAN) 4 MG tablet Take 1 tablet (4 mg total) by mouth every 6 (six) hours. 12/12/12   Glynn Octave, MD  ondansetron (ZOFRAN-ODT) 4 MG disintegrating tablet Take 4 mg by mouth every 8 (eight) hours as needed for nausea.    Historical Provider, MD  permethrin (ELIMITE) 5 % cream Apply to affected area once. Go to bed at night after you've apply the cream to wash off in the morning. If you're still itching in one week reapply a second time 07/25/12   Elson Areas, PA-C  permethrin Verner Mould) 5 % cream Apply to affected area once 08/16/12   Elson Areas, PA-C  Prenatal Vit-Fe Fumarate-FA (PRENATAL MULTIVITAMIN) TABS Take 1 tablet by mouth daily at 12 noon.    Historical Provider, MD   Triage Vitals: BP 173/99  Pulse 106  Temp(Src) 98 F (36.7 C) (Oral)  Resp 24  Ht 5\' 11"  (1.803 m)  Wt 370 lb (167.831 kg)  BMI 51.63 kg/m2  SpO2 100%  LMP 04/07/2014  Physical Exam  Nursing note and vitals reviewed. Constitutional: She is oriented to person, place, and time. She appears well-developed and well-nourished. No distress.  HENT:  Head: Normocephalic and atraumatic.   Eyes: Conjunctivae and EOM are normal.  Cardiovascular: Normal rate, regular rhythm, normal heart sounds and intact distal pulses.   No murmur heard. Appreciated pulses.   Pulmonary/Chest: Effort normal and breath sounds normal. No stridor. No respiratory distress.  Abdominal: She exhibits no distension.  Musculoskeletal: She exhibits no edema.  Legs symmetrical in size.  Neurological: She is alert and oriented to person, place, and time. No cranial nerve deficit. She exhibits normal muscle tone. Coordination normal.  Skin: Skin is warm and dry. No rash noted.  No erythema. No pallor.  Psychiatric: She has a normal mood and affect.    ED Course  Procedures (including critical care time)   COORDINATION OF CARE:  8:28 PM- Discussed treatment plan with patient, and the patient agreed to the plan. The plan includes BMP, UA, CK, and dilaudid.    Labs Review Labs Reviewed  BASIC METABOLIC PANEL - Abnormal; Notable for the following:    Glucose, Bld 104 (*)    All other components within normal limits  CK - Abnormal; Notable for the following:    Total CK 646 (*)    All other components within normal limits  URINALYSIS, ROUTINE W REFLEX MICROSCOPIC     On repeat exam after several liters of fluid resuscitation the patient has minimal discomfort, smiling, interacting appropriately. We had a lengthy conversation about all findings, return precautions, home care instructions, hydration instructions, change in medications, and the need to follow up with primary care to assure that she is on the appropriate medication regimen.  MDM   I personally performed the services described in this documentation, which was scribed in my presence. The recorded information has been reviewed and is accurate.  Patient presents with bilateral leg pain.  Patient is distal neurovascular status is appropriate, though subfascial notes pain with motion of the legs.  Patient is not hypoxic, has minimal risk  profile for PE or DVT. Patient's evaluation demonstrates a CK of 600. Patient had resolution of her symptoms with substantial fluid resuscitation. CK elevation may be secondary to medication interaction, versus additional activity from her new job. After switching her medication, provider several days off from work, and discussing hydration instructions at length, patient was discharged in stable condition to follow up with primary care.   Gerhard Munchobert Bryannah Boston, MD 04/15/14 2220

## 2014-04-14 NOTE — ED Notes (Signed)
Reports bilateral leg pain but left greater than right. Pt on feet all day.

## 2014-04-14 NOTE — Discharge Instructions (Signed)
As discussed, it is very important that you monitor your condition carefully, and stay very well hydrated. Please talk with your physician on Monday to ensure that you're taking the appropriate medication for your condition. 1 return here for concerning changes in your condition. With

## 2014-06-25 ENCOUNTER — Encounter (HOSPITAL_BASED_OUTPATIENT_CLINIC_OR_DEPARTMENT_OTHER): Payer: Self-pay | Admitting: Emergency Medicine

## 2014-07-20 ENCOUNTER — Encounter (HOSPITAL_BASED_OUTPATIENT_CLINIC_OR_DEPARTMENT_OTHER): Payer: Self-pay

## 2014-07-20 ENCOUNTER — Emergency Department (HOSPITAL_BASED_OUTPATIENT_CLINIC_OR_DEPARTMENT_OTHER)
Admission: EM | Admit: 2014-07-20 | Discharge: 2014-07-20 | Disposition: A | Discharge status: Home / Self Care | Payer: Medicaid Other | Attending: Emergency Medicine | Admitting: Emergency Medicine

## 2014-07-20 DIAGNOSIS — E780 Pure hypercholesterolemia: Secondary | ICD-10-CM | POA: Diagnosis not present

## 2014-07-20 DIAGNOSIS — H1033 Unspecified acute conjunctivitis, bilateral: Secondary | ICD-10-CM | POA: Insufficient documentation

## 2014-07-20 DIAGNOSIS — H109 Unspecified conjunctivitis: Secondary | ICD-10-CM

## 2014-07-20 DIAGNOSIS — I10 Essential (primary) hypertension: Secondary | ICD-10-CM | POA: Insufficient documentation

## 2014-07-20 DIAGNOSIS — Z79899 Other long term (current) drug therapy: Secondary | ICD-10-CM | POA: Insufficient documentation

## 2014-07-20 DIAGNOSIS — I509 Heart failure, unspecified: Secondary | ICD-10-CM | POA: Insufficient documentation

## 2014-07-20 DIAGNOSIS — Z3202 Encounter for pregnancy test, result negative: Secondary | ICD-10-CM | POA: Insufficient documentation

## 2014-07-20 DIAGNOSIS — Z720 Tobacco use: Secondary | ICD-10-CM | POA: Insufficient documentation

## 2014-07-20 DIAGNOSIS — Z7982 Long term (current) use of aspirin: Secondary | ICD-10-CM | POA: Insufficient documentation

## 2014-07-20 DIAGNOSIS — N898 Other specified noninflammatory disorders of vagina: Secondary | ICD-10-CM | POA: Diagnosis present

## 2014-07-20 LAB — WET PREP, GENITAL
Trich, Wet Prep: NONE SEEN
Yeast Wet Prep HPF POC: NONE SEEN

## 2014-07-20 LAB — URINALYSIS, ROUTINE W REFLEX MICROSCOPIC
Bilirubin Urine: NEGATIVE
GLUCOSE, UA: NEGATIVE mg/dL
Hgb urine dipstick: NEGATIVE
KETONES UR: NEGATIVE mg/dL
LEUKOCYTES UA: NEGATIVE
NITRITE: NEGATIVE
PH: 6 (ref 5.0–8.0)
Protein, ur: NEGATIVE mg/dL
Specific Gravity, Urine: 1.013 (ref 1.005–1.030)
Urobilinogen, UA: 0.2 mg/dL (ref 0.0–1.0)

## 2014-07-20 LAB — PREGNANCY, URINE: Preg Test, Ur: NEGATIVE

## 2014-07-20 MED ORDER — CROMOLYN SODIUM 4 % OP SOLN: 1.0000 [drp] | Freq: Four times a day (QID) | OPHTHALMIC | Status: DC

## 2014-07-20 NOTE — ED Notes (Signed)
Pt reports bilateral eye drainage, pink sclera, itching since yesterday. Also reports small amount of vaginal discharge and states she wants to be checked because her partner was diagnosed with a UTI/bacteria this week.

## 2014-07-20 NOTE — Discharge Instructions (Signed)
You have viral conjunctivitis most likely. You should apply cromolyn to each eye as directed. This should go away on its own, hopefully by Monday. If it is getting worse or your develop trouble seeing you need to seek medical attention right away.  Some of your results are still pending and you can call for those results.  Make sure everybody washes their hands!

## 2014-07-20 NOTE — ED Provider Notes (Signed)
CSN: [id number]     Arrival date & time 07/20/14  1011 History   First MD Initiated Contact with Patient 07/20/14 1140     Chief Complaint  Patient presents with  . Vaginal Discharge   This is a 38 yo female presenting with 2 complaints: vaginal discharge and red eye.   Vaginal discharge began weeks ago and has remained the same without treatment. it is brown, not malodorous, intermittent without irritation or burning. She is sexually active with 1 monogamous female partner who has recently been diagnosed with 2 UTIs in the past several weeks. She was told to get tested and requests full STI testing. + h/o trich, gonorrhea, chlamydia in the past all treated.   Eye redness began 3 days ago in the right eye then 2 days ago the left eye. Visual acuity and ocular movements are not affected. No photophobia or HA. She does not wear contact lenses. Mild eye pain. Denies grittyness but has itching.   (Consider location/radiation/quality/duration/timing/severity/associated sxs/prior Treatment) HPI  Past Medical History  Diagnosis Date  . Hypertension   . CHF (congestive heart failure)   . Heart valve problem   . High cholesterol    History reviewed. No pertinent past surgical history. History reviewed. No pertinent family history. History  Substance Use Topics  . Smoking status: Current Every Day Smoker -- 1.50 packs/day    Types: Cigarettes  . Smokeless tobacco: Not on file  . Alcohol Use: Yes     Comment: social   OB History    Gravida Para Term Preterm AB TAB SAB Ectopic Multiple Living   6 3 3  0 2 2 0 0 0 3     Review of Systems  Constitutional: Negative for fever, chills and fatigue.  HENT: Negative for ear discharge, ear pain, rhinorrhea, sinus pressure, sneezing, sore throat and trouble swallowing.   Eyes: Positive for pain, discharge, redness (nasal conjunctivae erythematous with perilimbal sparing) and itching. Negative for photophobia and visual disturbance.  Respiratory:  Negative for cough, shortness of breath and wheezing.   Cardiovascular: Negative for chest pain.  Gastrointestinal: Negative for nausea, abdominal pain and diarrhea.  Genitourinary: Negative for dysuria, urgency, frequency, hematuria, flank pain and vaginal bleeding.  Musculoskeletal: Negative for myalgias and back pain.  Neurological: Negative for syncope and headaches.    Allergies  Review of patient's allergies indicates no known allergies.  Home Medications   Prior to Admission medications   Medication Sig Start Date End Date Taking? Authorizing Provider  amLODipine (NORVASC) 10 MG tablet Take 10 mg by mouth daily.   Yes Historical Provider, MD  aspirin 81 MG tablet Take 81 mg by mouth daily.   Yes Historical Provider, MD  furosemide (LASIX) 20 MG tablet Take 20 mg by mouth.   Yes Historical Provider, MD  ibuprofen (ADVIL,MOTRIN) 800 MG tablet Take 1 tablet (800 mg total) by mouth 3 (three) times daily. 01/07/14  Yes Shari A Upstill, PA-C  lisinopril (PRINIVIL,ZESTRIL) 20 MG tablet Take 20 mg by mouth daily.   Yes Historical Provider, MD  metoprolol succinate (TOPROL-XL) 100 MG 24 hr tablet Take 100 mg by mouth daily. Take with or immediately following a meal.   Yes Historical Provider, MD  cromolyn (OPTICROM) 4 % ophthalmic solution Place 1 drop into both eyes 4 (four) times daily. 07/20/14   Tyrone Nineyan B Cary Lothrop, MD  cyclobenzaprine (FLEXERIL) 10 MG tablet Take 1 tablet (10 mg total) by mouth 3 (three) times daily. 01/07/14   Arnoldo HookerShari A Upstill, PA-C  docusate sodium (COLACE) 100 MG capsule Take 1 capsule (100 mg total) by mouth every 12 (twelve) hours. 12/12/12   Glynn OctaveStephen Rancour, MD  hydrochlorothiazide (HYDRODIURIL) 25 MG tablet Take 1 tablet (25 mg total) by mouth daily. 07/12/12   Gwyneth SproutWhitney Plunkett, MD  HYDROcodone-acetaminophen (NORCO/VICODIN) 5-325 MG per tablet Take 1-2 tablets by mouth every 4 (four) hours as needed for moderate pain. 01/07/14   Shari A Upstill, PA-C   HYDROcodone-acetaminophen (NORCO/VICODIN) 5-325 MG per tablet Take 1 tablet by mouth every 6 (six) hours as needed for severe pain. 04/14/14   Gerhard Munchobert Lockwood, MD  hydrocortisone (ANUSOL-HC) 2.5 % rectal cream Apply rectally 2 times daily 12/12/12   Glynn OctaveStephen Rancour, MD  hydrOXYzine (ATARAX/VISTARIL) 25 MG tablet Take 1 tablet (25 mg total) by mouth every 6 (six) hours. 08/16/12   Elson AreasLeslie K Sofia, PA-C  LORazepam (ATIVAN) 1 MG tablet Take 1 tablet (1 mg total) by mouth every 8 (eight) hours. 08/16/12   Elson AreasLeslie K Sofia, PA-C  methyldopa (ALDOMET) 500 MG tablet Take 500 mg by mouth 3 (three) times daily.    Historical Provider, MD  metroNIDAZOLE (FLAGYL) 500 MG tablet Take 1 tablet (500 mg total) by mouth 2 (two) times daily. 12/12/12   Glynn OctaveStephen Rancour, MD  ondansetron (ZOFRAN) 4 MG tablet Take 1 tablet (4 mg total) by mouth every 6 (six) hours. 12/12/12   Glynn OctaveStephen Rancour, MD  ondansetron (ZOFRAN-ODT) 4 MG disintegrating tablet Take 4 mg by mouth every 8 (eight) hours as needed for nausea.    Historical Provider, MD  permethrin (ELIMITE) 5 % cream Apply to affected area once. Go to bed at night after you've apply the cream to wash off in the morning. If you're still itching in one week reapply a second time 07/25/12   Elson AreasLeslie K Sofia, PA-C  permethrin Verner Mould(ELIMITE) 5 % cream Apply to affected area once 08/16/12   Elson AreasLeslie K Sofia, PA-C  Prenatal Vit-Fe Fumarate-FA (PRENATAL MULTIVITAMIN) TABS Take 1 tablet by mouth daily at 12 noon.    Historical Provider, MD   BP 143/80 mmHg  Pulse 78  Temp(Src) 98.2 F (36.8 C) (Oral)  Resp 18  Ht 5\' 11"  (1.803 m)  Wt 370 lb (167.831 kg)  BMI 51.63 kg/m2  SpO2 100%  LMP 06/19/2014 Physical Exam  Constitutional: She is oriented to person, place, and time. She appears well-developed and well-nourished. No distress.  HENT:  Right Ear: External ear normal.  Left Ear: External ear normal.  Nose: Nose normal.  Mouth/Throat: Oropharynx is clear and moist.  Eyes: EOM are  normal. Pupils are equal, round, and reactive to light. Right eye exhibits no discharge. Left eye exhibits no discharge. No scleral icterus.  Neck: Neck supple. No JVD present.  Cardiovascular: Normal rate, regular rhythm, normal heart sounds and intact distal pulses.   No murmur heard. Pulmonary/Chest: Effort normal and breath sounds normal. No respiratory distress.  Abdominal: Soft. Bowel sounds are normal. She exhibits no distension. There is no tenderness.  Genitourinary: Vagina normal and uterus normal. No vaginal discharge found.  Pelvic: External genitalia within normal limits.  Vaginal mucosa pink, moist, normal rugae.  Nonfriable cervix without lesions, no discharge or bleeding noted on speculum exam.  No cervical motion tenderness.  Tech present throughout duration of exam.   Musculoskeletal: Normal range of motion. She exhibits no edema or tenderness.  Lymphadenopathy:    She has no cervical adenopathy.  Neurological: She is alert and oriented to person, place, and time. She exhibits normal muscle tone.  Skin: Skin is warm and dry.  Vitals reviewed.   ED Course  Procedures (including critical care time) Labs Review Labs Reviewed  WET PREP, GENITAL - Abnormal; Notable for the following:    Clue Cells Wet Prep HPF POC FEW (*)    WBC, Wet Prep HPF POC FEW (*)    All other components within normal limits  GC/CHLAMYDIA PROBE AMP  URINALYSIS, ROUTINE W REFLEX MICROSCOPIC  PREGNANCY, URINE    Imaging Review No results found.   EKG Interpretation None      MDM   Final diagnoses:  Bilateral conjunctivitis   Vaginal discharge: Not found on exam. Wet prep and GC/chlamydia.  - Negative wet prep - No Tx at this time unless GC/chl returns positive will just recommend stop douching   Conjunctivitis, viral vs. allergic.  - Cromolyn ophthalmic drops - Hand hygeine - Note for work given to return 11/30     Nelia Shi, MD 07/21/14 [phone]

## 2014-07-21 LAB — GC/CHLAMYDIA PROBE AMP
CT Probe RNA: NEGATIVE
GC PROBE AMP APTIMA: NEGATIVE

## 2014-07-22 ENCOUNTER — Emergency Department (HOSPITAL_BASED_OUTPATIENT_CLINIC_OR_DEPARTMENT_OTHER)
Admission: EM | Admit: 2014-07-22 | Discharge: 2014-07-22 | Disposition: A | Discharge status: Home / Self Care | Payer: Medicaid Other | Attending: Emergency Medicine | Admitting: Emergency Medicine

## 2014-07-22 ENCOUNTER — Encounter (HOSPITAL_BASED_OUTPATIENT_CLINIC_OR_DEPARTMENT_OTHER): Payer: Self-pay | Admitting: Emergency Medicine

## 2014-07-22 DIAGNOSIS — Z8639 Personal history of other endocrine, nutritional and metabolic disease: Secondary | ICD-10-CM | POA: Insufficient documentation

## 2014-07-22 DIAGNOSIS — H1033 Unspecified acute conjunctivitis, bilateral: Secondary | ICD-10-CM | POA: Diagnosis present

## 2014-07-22 DIAGNOSIS — Z720 Tobacco use: Secondary | ICD-10-CM | POA: Insufficient documentation

## 2014-07-22 DIAGNOSIS — Z792 Long term (current) use of antibiotics: Secondary | ICD-10-CM | POA: Diagnosis not present

## 2014-07-22 DIAGNOSIS — Z791 Long term (current) use of non-steroidal anti-inflammatories (NSAID): Secondary | ICD-10-CM | POA: Insufficient documentation

## 2014-07-22 DIAGNOSIS — Z79899 Other long term (current) drug therapy: Secondary | ICD-10-CM | POA: Diagnosis not present

## 2014-07-22 DIAGNOSIS — H109 Unspecified conjunctivitis: Secondary | ICD-10-CM

## 2014-07-22 DIAGNOSIS — Z7982 Long term (current) use of aspirin: Secondary | ICD-10-CM | POA: Insufficient documentation

## 2014-07-22 DIAGNOSIS — I509 Heart failure, unspecified: Secondary | ICD-10-CM | POA: Insufficient documentation

## 2014-07-22 DIAGNOSIS — Z7952 Long term (current) use of systemic steroids: Secondary | ICD-10-CM | POA: Insufficient documentation

## 2014-07-22 DIAGNOSIS — I10 Essential (primary) hypertension: Secondary | ICD-10-CM | POA: Insufficient documentation

## 2014-07-22 MED ORDER — POLYMYXIN B-TRIMETHOPRIM 10000-0.1 UNIT/ML-% OP SOLN: 1.0000 [drp] | OPHTHALMIC | Status: DC

## 2014-07-22 MED ORDER — POLYMYXIN B-TRIMETHOPRIM 10000-0.1 UNIT/ML-% OP SOLN: 2.0000 [drp] | Freq: Once | OPHTHALMIC | Status: DC

## 2014-07-22 NOTE — Discharge Instructions (Signed)
°  Follow with the eye doctor in the next 24 to 28 hours  Do not reuse your contact lenses and do not use any contact lenses until you are cleared by the eye doctor.   Wash your hands frequently and try to keep your hands away from the affected eye(s).   You should be feeling some improvement by 48 hours. If symptoms worsen, you develop pain, change in your vision or no improvement in 48 hours please follow with the ophthalmologist or, if that is not possible, return to the emergency room for a recheck.  Please follow with your primary care doctor in the next 2 days for a check-up. They must obtain records for further management.   Do not hesitate to return to the Emergency Department for any new, worsening or concerning symptoms.    Conjunctivitis Conjunctivitis is commonly called "pink eye." Conjunctivitis can be caused by bacterial or viral infection, allergies, or injuries. There is usually redness of the lining of the eye, itching, discomfort, and sometimes discharge. There may be deposits of matter along the eyelids. A viral infection usually causes a watery discharge, while a bacterial infection causes a yellowish, thick discharge. Pink eye is very contagious and spreads by direct contact. You may be given antibiotic eyedrops as part of your treatment. Before using your eye medicine, remove all drainage from the eye by washing gently with warm water and cotton balls. Continue to use the medication until you have awakened 2 mornings in a row without discharge from the eye. Do not rub your eye. This increases the irritation and helps spread infection. Use separate towels from other household members. Wash your hands with soap and water before and after touching your eyes. Use cold compresses to reduce pain and sunglasses to relieve irritation from light. Do not wear contact lenses or wear eye makeup until the infection is gone. SEEK MEDICAL CARE IF:   Your symptoms are not better after 3 days  of treatment.  You have increased pain or trouble seeing.  The outer eyelids become very red or swollen. Document Released: 09/17/2004 Document Revised: 11/02/2011 Document Reviewed: 08/10/2005 [facility] Patient Information 2015 [employer], [place]. This information is not intended to replace advice given to you by your health care provider. Make sure you discuss any questions you have with your health care provider.

## 2014-07-22 NOTE — ED Provider Notes (Signed)
CSN: [id number]     Arrival date & time 07/22/14  1246 History   First MD Initiated Contact with Patient 07/22/14 1342     Chief Complaint  Patient presents with  . Conjunctivitis     (Consider location/radiation/quality/duration/timing/severity/associated sxs/prior Treatment) HPI   Gabriela Thompson is a 38 y.o. female complaining of bilateral eye redness, gritty sensation and scant discharge starting proximally 7 days ago. Patient denies fever, lid swelling, pain with eye movement, diplopia, contact lens use. She's been taking cromolyn drops for the last 3 days as prescribed without relief.  Past Medical History  Diagnosis Date  . Hypertension   . CHF (congestive heart failure)   . Heart valve problem   . High cholesterol    History reviewed. No pertinent past surgical history. No family history on file. History  Substance Use Topics  . Smoking status: Current Every Day Smoker -- 1.50 packs/day    Types: Cigarettes  . Smokeless tobacco: Not on file  . Alcohol Use: Yes     Comment: social   OB History    Gravida Para Term Preterm AB TAB SAB Ectopic Multiple Living   6 3 3  0 2 2 0 0 0 3     Review of Systems  10 systems reviewed and found to be negative, except as noted in the HPI.   Allergies  Review of patient's allergies indicates no known allergies.  Home Medications   Prior to Admission medications   Medication Sig Start Date End Date Taking? Authorizing Provider  amLODipine (NORVASC) 10 MG tablet Take 10 mg by mouth daily.    Historical Provider, MD  aspirin 81 MG tablet Take 81 mg by mouth daily.    Historical Provider, MD  cromolyn (OPTICROM) 4 % ophthalmic solution Place 1 drop into both eyes 4 (four) times daily. 07/20/14   Tyrone Nine, MD  cyclobenzaprine (FLEXERIL) 10 MG tablet Take 1 tablet (10 mg total) by mouth 3 (three) times daily. 01/07/14   Shari A Upstill, PA-C  docusate sodium (COLACE) 100 MG capsule Take 1 capsule (100 mg total) by mouth every  12 (twelve) hours. 12/12/12   Glynn Octave, MD  furosemide (LASIX) 20 MG tablet Take 20 mg by mouth.    Historical Provider, MD  hydrochlorothiazide (HYDRODIURIL) 25 MG tablet Take 1 tablet (25 mg total) by mouth daily. 07/12/12   Gwyneth Sprout, MD  HYDROcodone-acetaminophen (NORCO/VICODIN) 5-325 MG per tablet Take 1-2 tablets by mouth every 4 (four) hours as needed for moderate pain. 01/07/14   Shari A Upstill, PA-C  HYDROcodone-acetaminophen (NORCO/VICODIN) 5-325 MG per tablet Take 1 tablet by mouth every 6 (six) hours as needed for severe pain. 04/14/14   Gerhard Munch, MD  hydrocortisone (ANUSOL-HC) 2.5 % rectal cream Apply rectally 2 times daily 12/12/12   Glynn Octave, MD  hydrOXYzine (ATARAX/VISTARIL) 25 MG tablet Take 1 tablet (25 mg total) by mouth every 6 (six) hours. 08/16/12   Elson Areas, PA-C  ibuprofen (ADVIL,MOTRIN) 800 MG tablet Take 1 tablet (800 mg total) by mouth 3 (three) times daily. 01/07/14   Shari A Upstill, PA-C  lisinopril (PRINIVIL,ZESTRIL) 20 MG tablet Take 20 mg by mouth daily.    Historical Provider, MD  LORazepam (ATIVAN) 1 MG tablet Take 1 tablet (1 mg total) by mouth every 8 (eight) hours. 08/16/12   Elson Areas, PA-C  methyldopa (ALDOMET) 500 MG tablet Take 500 mg by mouth 3 (three) times daily.    Historical Provider, MD  metoprolol succinate (  TOPROL-XL) 100 MG 24 hr tablet Take 100 mg by mouth daily. Take with or immediately following a meal.    Historical Provider, MD  metroNIDAZOLE (FLAGYL) 500 MG tablet Take 1 tablet (500 mg total) by mouth 2 (two) times daily. 12/12/12   Glynn OctaveStephen Rancour, MD  ondansetron (ZOFRAN) 4 MG tablet Take 1 tablet (4 mg total) by mouth every 6 (six) hours. 12/12/12   Glynn OctaveStephen Rancour, MD  ondansetron (ZOFRAN-ODT) 4 MG disintegrating tablet Take 4 mg by mouth every 8 (eight) hours as needed for nausea.    Historical Provider, MD  permethrin (ELIMITE) 5 % cream Apply to affected area once. Go to bed at night after you've apply the  cream to wash off in the morning. If you're still itching in one week reapply a second time 07/25/12   Elson AreasLeslie K Sofia, PA-C  permethrin Verner Mould(ELIMITE) 5 % cream Apply to affected area once 08/16/12   Elson AreasLeslie K Sofia, PA-C  Prenatal Vit-Fe Fumarate-FA (PRENATAL MULTIVITAMIN) TABS Take 1 tablet by mouth daily at 12 noon.    Historical Provider, MD  trimethoprim-polymyxin b (POLYTRIM) ophthalmic solution Place 1 drop into the right eye every 4 (four) hours. 07/22/14   Kylyn Mcdade, PA-C   BP 161/94 mmHg  Pulse 83  Temp(Src) 98.3 F (36.8 C) (Oral)  Resp 18  Ht 5\' 11"  (1.803 m)  Wt 370 lb (167.831 kg)  BMI 51.63 kg/m2  SpO2 99%  LMP 06/19/2014 Physical Exam  Constitutional: She is oriented to person, place, and time. She appears well-developed and well-nourished. No distress.  HENT:  Head: Normocephalic and atraumatic.  Mouth/Throat: Oropharynx is clear and moist.  Eyes: Conjunctivae and EOM are normal. Pupils are equal, round, and reactive to light.  Pupils equal round and reactive to light, there is moderate bilateral conjunctival injection with scant clear discharge bilaterally. Extraocular movement is intact without pain or diplopia. No significant lid swelling, no proptosis  Neck: Normal range of motion. Neck supple.  Cardiovascular: Normal rate, regular rhythm and intact distal pulses.   Pulmonary/Chest: Effort normal and breath sounds normal. No stridor. No respiratory distress. She has no wheezes. She has no rales. She exhibits no tenderness.  Abdominal: Soft. There is no tenderness.  Musculoskeletal: Normal range of motion.  Neurological: She is alert and oriented to person, place, and time.  Psychiatric: She has a normal mood and affect.  Nursing note and vitals reviewed.   ED Course  Procedures (including critical care time) Labs Review Labs Reviewed - No data to display  Imaging Review No results found.   EKG Interpretation None      MDM   Final diagnoses:   Bilateral conjunctivitis    Filed Vitals:   07/22/14 1254  BP: 161/94  Pulse: 83  Temp: 98.3 F (36.8 C)  TempSrc: Oral  Resp: 18  Height: 5\' 11"  (1.803 m)  Weight: 370 lb (167.831 kg)  SpO2: 99%    Medications  trimethoprim-polymyxin b (POLYTRIM) ophthalmic solution 2 drop (not administered)    Valarie ConesLakisha Pfister is a 38 y.o. female presenting with erythematous eyes with mild discharge and gritty foreign body sensation. Patient has been treated with cromolyn drops with little relief. Will start antibiotic Polytrim drops.   Evaluation does not show pathology that would require ongoing emergent intervention or inpatient treatment. Pt is hemodynamically stable and mentating appropriately. Discussed findings and plan with patient/guardian, who agrees with care plan. All questions answered. Return precautions discussed and outpatient follow up given.   New Prescriptions  TRIMETHOPRIM-POLYMYXIN B (POLYTRIM) OPHTHALMIC SOLUTION    Place 1 drop into the right eye every 4 (four) hours.         Wynetta Emeryicole Letisia Schwalb, PA-C 07/22/14 1355  Audree CamelScott T Goldston, MD 07/25/14 1017

## 2014-07-22 NOTE — ED Notes (Signed)
Pt presents to ED with complaints of pink eye . Pt was here Friday and was given meds for pink eye but both eyes are worse now.

## 2014-08-28 ENCOUNTER — Encounter (HOSPITAL_BASED_OUTPATIENT_CLINIC_OR_DEPARTMENT_OTHER): Payer: Self-pay | Admitting: Emergency Medicine

## 2014-08-28 ENCOUNTER — Emergency Department (HOSPITAL_BASED_OUTPATIENT_CLINIC_OR_DEPARTMENT_OTHER)
Admission: EM | Admit: 2014-08-28 | Discharge: 2014-08-28 | Disposition: A | Discharge status: Home / Self Care | Payer: Medicaid Other | Attending: Emergency Medicine | Admitting: Emergency Medicine

## 2014-08-28 DIAGNOSIS — I509 Heart failure, unspecified: Secondary | ICD-10-CM | POA: Insufficient documentation

## 2014-08-28 DIAGNOSIS — M25562 Pain in left knee: Secondary | ICD-10-CM | POA: Diagnosis not present

## 2014-08-28 DIAGNOSIS — Z7982 Long term (current) use of aspirin: Secondary | ICD-10-CM | POA: Diagnosis not present

## 2014-08-28 DIAGNOSIS — M79662 Pain in left lower leg: Secondary | ICD-10-CM | POA: Diagnosis not present

## 2014-08-28 DIAGNOSIS — Z79899 Other long term (current) drug therapy: Secondary | ICD-10-CM | POA: Diagnosis not present

## 2014-08-28 DIAGNOSIS — M79606 Pain in leg, unspecified: Secondary | ICD-10-CM

## 2014-08-28 DIAGNOSIS — Z7952 Long term (current) use of systemic steroids: Secondary | ICD-10-CM | POA: Diagnosis not present

## 2014-08-28 DIAGNOSIS — R0981 Nasal congestion: Secondary | ICD-10-CM | POA: Insufficient documentation

## 2014-08-28 DIAGNOSIS — Z792 Long term (current) use of antibiotics: Secondary | ICD-10-CM | POA: Diagnosis not present

## 2014-08-28 DIAGNOSIS — I10 Essential (primary) hypertension: Secondary | ICD-10-CM | POA: Diagnosis not present

## 2014-08-28 DIAGNOSIS — Z791 Long term (current) use of non-steroidal anti-inflammatories (NSAID): Secondary | ICD-10-CM | POA: Diagnosis not present

## 2014-08-28 DIAGNOSIS — Z8639 Personal history of other endocrine, nutritional and metabolic disease: Secondary | ICD-10-CM | POA: Insufficient documentation

## 2014-08-28 DIAGNOSIS — Z720 Tobacco use: Secondary | ICD-10-CM | POA: Insufficient documentation

## 2014-08-28 DIAGNOSIS — M25561 Pain in right knee: Secondary | ICD-10-CM | POA: Insufficient documentation

## 2014-08-28 LAB — BASIC METABOLIC PANEL
Anion gap: 7 (ref 5–15)
BUN: 13 mg/dL (ref 6–23)
CALCIUM: 8.7 mg/dL (ref 8.4–10.5)
CHLORIDE: 105 meq/L (ref 96–112)
CO2: 25 mmol/L (ref 19–32)
Creatinine, Ser: 0.8 mg/dL (ref 0.50–1.10)
GLUCOSE: 112 mg/dL — AB (ref 70–99)
POTASSIUM: 3.5 mmol/L (ref 3.5–5.1)
SODIUM: 137 mmol/L (ref 135–145)

## 2014-08-28 LAB — CBC WITH DIFFERENTIAL/PLATELET
Basophils Absolute: 0 10*3/uL (ref 0.0–0.1)
Basophils Relative: 0 % (ref 0–1)
Eosinophils Absolute: 1 10*3/uL — ABNORMAL HIGH (ref 0.0–0.7)
Eosinophils Relative: 9 % — ABNORMAL HIGH (ref 0–5)
HCT: 39.3 % (ref 36.0–46.0)
Hemoglobin: 12.7 g/dL (ref 12.0–15.0)
Lymphocytes Relative: 31 % (ref 12–46)
Lymphs Abs: 3.5 10*3/uL (ref 0.7–4.0)
MCH: 28 pg (ref 26.0–34.0)
MCHC: 32.3 g/dL (ref 30.0–36.0)
MCV: 86.8 fL (ref 78.0–100.0)
Monocytes Absolute: 0.6 10*3/uL (ref 0.1–1.0)
Monocytes Relative: 5 % (ref 3–12)
NEUTROS ABS: 6.1 10*3/uL (ref 1.7–7.7)
NEUTROS PCT: 55 % (ref 43–77)
PLATELETS: 344 10*3/uL (ref 150–400)
RBC: 4.53 MIL/uL (ref 3.87–5.11)
RDW: 14.4 % (ref 11.5–15.5)
WBC: 11.2 10*3/uL — ABNORMAL HIGH (ref 4.0–10.5)

## 2014-08-28 LAB — D-DIMER, QUANTITATIVE: D-Dimer, Quant: 0.31 ug/mL-FEU (ref 0.00–0.48)

## 2014-08-28 MED ORDER — CYCLOBENZAPRINE HCL 10 MG PO TABS: 10.0000 mg | ORAL_TABLET | Freq: Two times a day (BID) | ORAL | Status: DC | PRN

## 2014-08-28 MED ORDER — HYDROMORPHONE HCL 1 MG/ML IJ SOLN
1.0000 mg | Freq: Once | INTRAMUSCULAR | Status: AC
  Administered 2014-08-28: 1 mg via INTRAMUSCULAR
  Filled 2014-08-28: qty 1

## 2014-08-28 NOTE — ED Provider Notes (Signed)
CSN: [id number]     Arrival date & time 08/28/14  2132 History  This chart was scribed for Mirian MoMatthew Gentry, MD by Charline BillsEssence Howell, ED Scribe. The patient was seen in room MH04/MH04. Patient's care was started at 10:06 PM.   Chief Complaint  Patient presents with  . Leg Pain   The history is provided by the patient. No language interpreter was used.   HPI Comments: Gabriela Thompson is a 39 y.o. female, with a h/o OA, HTN, CHF, high cholesterol, who presents to the Emergency Department complaining of constant, gradually worsening bil leg pain L>R onset 4 days ago. Pt states that pain began approximately 5 months ago. She reports pain from her L knee to her L ankle that is exacerbated with walking. Pt states that it "feels like my leg will give out". Pt reports congestion. She denies fever, nausea, vomiting, sore throat, chest pain, SOB. She has been treating with ibuprofen without relief. She has also tried Percocet 5 but states that she ran out of medication. No personal medical h/o DVT but pt's sister has h/o DVT. Pt is not currently taking birth control pills. Pt also reports mild R knee pain that she received Cortizone injection for with 3 days of relief. Pt had a leg US 2 months ago that was normal.   Past Medical History  Diagnosis Date  . Hypertension   . CHF (congestive heart failure)   . Heart valve problem   . High cholesterol    History reviewed. No pertinent past surgical history. History reviewed. No pertinent family history. History  Substance Use Topics  . Smoking status: Current Every Day Smoker -- 1.50 packs/day    Types: Cigarettes  . Smokeless tobacco: Not on file  . Alcohol Use: Yes     Comment: social   OB History    Gravida Para Term Preterm AB TAB SAB Ectopic Multiple Living   6 3 3  0 2 2 0 0 0 3     Review of Systems  Constitutional: Negative for fever.  HENT: Positive for congestion. Negative for sore throat.   Respiratory: Negative for shortness of breath.    Cardiovascular: Negative for chest pain.  Gastrointestinal: Negative for nausea and vomiting.  Musculoskeletal: Positive for myalgias and arthralgias.  All other systems reviewed and are negative.  Allergies  Review of patient's allergies indicates no known allergies.  Home Medications   Prior to Admission medications   Medication Sig Start Date End Date Taking? Authorizing Provider  amLODipine (NORVASC) 10 MG tablet Take 10 mg by mouth daily.    Historical Provider, MD  aspirin 81 MG tablet Take 81 mg by mouth daily.    Historical Provider, MD  cromolyn (OPTICROM) 4 % ophthalmic solution Place 1 drop into both eyes 4 (four) times daily. 07/20/14   Tyrone Nineyan B Grunz, MD  cyclobenzaprine (FLEXERIL) 10 MG tablet Take 1 tablet (10 mg total) by mouth 3 (three) times daily. 01/07/14   Shari A Upstill, PA-C  cyclobenzaprine (FLEXERIL) 10 MG tablet Take 1 tablet (10 mg total) by mouth 2 (two) times daily as needed for muscle spasms. 08/28/14   Mirian MoMatthew Gentry, MD  docusate sodium (COLACE) 100 MG capsule Take 1 capsule (100 mg total) by mouth every 12 (twelve) hours. 12/12/12   Glynn OctaveStephen Rancour, MD  furosemide (LASIX) 20 MG tablet Take 20 mg by mouth.    Historical Provider, MD  hydrochlorothiazide (HYDRODIURIL) 25 MG tablet Take 1 tablet (25 mg total) by mouth daily. 07/12/12  Gwyneth Sprout, MD  HYDROcodone-acetaminophen (NORCO/VICODIN) 5-325 MG per tablet Take 1-2 tablets by mouth every 4 (four) hours as needed for moderate pain. 01/07/14   Shari A Upstill, PA-C  HYDROcodone-acetaminophen (NORCO/VICODIN) 5-325 MG per tablet Take 1 tablet by mouth every 6 (six) hours as needed for severe pain. 04/14/14   Gerhard Munch, MD  hydrocortisone (ANUSOL-HC) 2.5 % rectal cream Apply rectally 2 times daily 12/12/12   Glynn Octave, MD  hydrOXYzine (ATARAX/VISTARIL) 25 MG tablet Take 1 tablet (25 mg total) by mouth every 6 (six) hours. 08/16/12   Elson Areas, PA-C  ibuprofen (ADVIL,MOTRIN) 800 MG tablet Take 1  tablet (800 mg total) by mouth 3 (three) times daily. 01/07/14   Shari A Upstill, PA-C  lisinopril (PRINIVIL,ZESTRIL) 20 MG tablet Take 20 mg by mouth daily.    Historical Provider, MD  LORazepam (ATIVAN) 1 MG tablet Take 1 tablet (1 mg total) by mouth every 8 (eight) hours. 08/16/12   Elson Areas, PA-C  methyldopa (ALDOMET) 500 MG tablet Take 500 mg by mouth 3 (three) times daily.    Historical Provider, MD  metoprolol succinate (TOPROL-XL) 100 MG 24 hr tablet Take 100 mg by mouth daily. Take with or immediately following a meal.    Historical Provider, MD  metroNIDAZOLE (FLAGYL) 500 MG tablet Take 1 tablet (500 mg total) by mouth 2 (two) times daily. 12/12/12   Glynn Octave, MD  ondansetron (ZOFRAN) 4 MG tablet Take 1 tablet (4 mg total) by mouth every 6 (six) hours. 12/12/12   Glynn Octave, MD  ondansetron (ZOFRAN-ODT) 4 MG disintegrating tablet Take 4 mg by mouth every 8 (eight) hours as needed for nausea.    Historical Provider, MD  permethrin (ELIMITE) 5 % cream Apply to affected area once. Go to bed at night after you've apply the cream to wash off in the morning. If you're still itching in one week reapply a second time 07/25/12   Elson Areas, PA-C  permethrin Verner Mould) 5 % cream Apply to affected area once 08/16/12   Elson Areas, PA-C  Prenatal Vit-Fe Fumarate-FA (PRENATAL MULTIVITAMIN) TABS Take 1 tablet by mouth daily at 12 noon.    Historical Provider, MD  trimethoprim-polymyxin b (POLYTRIM) ophthalmic solution Place 1 drop into the right eye every 4 (four) hours. 07/22/14   Nicole Pisciotta, PA-C   BP 178/99 mmHg  Pulse 92  Temp(Src) 98.6 F (37 C) (Oral)  Resp 18  Ht  (1.803 m)  Wt 370 lb (167.831 kg)  BMI 51.63 kg/m2  SpO2 100%  LMP 08/24/2014 Physical Exam  Constitutional: She is oriented to person, place, and time. She appears well-developed and well-nourished.  HENT:  Head: Normocephalic and atraumatic.  Right Ear: External ear normal.  Left Ear:  External ear normal.  Eyes: Conjunctivae and EOM are normal. Pupils are equal, round, and reactive to light.  Neck: Normal range of motion. Neck supple.  Cardiovascular: Normal rate, regular rhythm, normal heart sounds and intact distal pulses.   Pulmonary/Chest: Effort normal and breath sounds normal.  Abdominal: Soft. Bowel sounds are normal. There is no tenderness.  Musculoskeletal: Normal range of motion.       Right knee: She exhibits normal range of motion, no swelling, no effusion and no erythema. Tenderness found.       Left knee: She exhibits normal range of motion, no swelling, no effusion and no erythema. Tenderness found.       Right lower leg: She exhibits no swelling and  no edema.       Left lower leg: She exhibits tenderness. She exhibits no swelling and no edema.  Neurological: She is alert and oriented to person, place, and time.  Skin: Skin is warm and dry.  Vitals reviewed.   ED Course  Procedures (including critical care time) DIAGNOSTIC STUDIES: Oxygen Saturation is 100% on RA, normal by my interpretation.    COORDINATION OF CARE: 10:12 PM-Discussed treatment plan which includes diagnostic lab work with pt at bedside and pt agreed to plan.   Labs Review Labs Reviewed  CBC WITH DIFFERENTIAL - Abnormal; Notable for the following:    WBC 11.2 (*)    Eosinophils Relative 9 (*)    Eosinophils Absolute 1.0 (*)    All other components within normal limits  BASIC METABOLIC PANEL - Abnormal; Notable for the following:    Glucose, Bld 112 (*)    All other components within normal limits  D-DIMER, QUANTITATIVE   Imaging Review No results found.   EKG Interpretation None      MDM   Final diagnoses:  Pain of lower extremity, unspecified laterality    39 y.o. female with pertinent PMH as above presents with recurrent leg pain.  She states her current pain is worse in the left side, which she states is atypical. She's concerned about a blood clot due to family  history of same.  She denies chest pain, dyspnea, any other symptoms. No recent fevers, systemic illness. On arrival vital signs and physical exam as above. No swelling, edema, signs of septic arthritis. Workup as above unremarkable including d-dimer. Likely etiology acute on chronic exacerbation of osteoarthritis. We'll have the patient follow-up with her orthopedist. Discharged home with standard return precautions.    I have reviewed all laboratory and imaging studies if ordered as above  1. Pain of lower extremity, unspecified laterality       I personally performed the services described in this documentation, which was scribed in my presence. The recorded information has been reviewed and is accurate.    Mirian Mo, MD 08/29/14 1540

## 2014-08-28 NOTE — ED Notes (Signed)
Pt states that she has had leg pain for several months went to orthopedics and they drained fluid out of need and gave Cortizone shot but over the past several days it has gotten worse.

## 2014-08-28 NOTE — Discharge Instructions (Signed)

## 2015-01-22 ENCOUNTER — Emergency Department (HOSPITAL_BASED_OUTPATIENT_CLINIC_OR_DEPARTMENT_OTHER)
Admission: EM | Admit: 2015-01-22 | Discharge: 2015-01-22 | Disposition: A | Discharge status: Home / Self Care | Payer: Medicaid Other | Attending: Emergency Medicine | Admitting: Emergency Medicine

## 2015-01-22 ENCOUNTER — Encounter (HOSPITAL_BASED_OUTPATIENT_CLINIC_OR_DEPARTMENT_OTHER): Payer: Self-pay | Admitting: Emergency Medicine

## 2015-01-22 DIAGNOSIS — Z791 Long term (current) use of non-steroidal anti-inflammatories (NSAID): Secondary | ICD-10-CM | POA: Insufficient documentation

## 2015-01-22 DIAGNOSIS — Z7952 Long term (current) use of systemic steroids: Secondary | ICD-10-CM | POA: Diagnosis not present

## 2015-01-22 DIAGNOSIS — Z79899 Other long term (current) drug therapy: Secondary | ICD-10-CM | POA: Diagnosis not present

## 2015-01-22 DIAGNOSIS — I509 Heart failure, unspecified: Secondary | ICD-10-CM | POA: Insufficient documentation

## 2015-01-22 DIAGNOSIS — Z7982 Long term (current) use of aspirin: Secondary | ICD-10-CM | POA: Diagnosis not present

## 2015-01-22 DIAGNOSIS — Z202 Contact with and (suspected) exposure to infections with a predominantly sexual mode of transmission: Secondary | ICD-10-CM | POA: Insufficient documentation

## 2015-01-22 DIAGNOSIS — Z8639 Personal history of other endocrine, nutritional and metabolic disease: Secondary | ICD-10-CM | POA: Diagnosis not present

## 2015-01-22 DIAGNOSIS — Z720 Tobacco use: Secondary | ICD-10-CM | POA: Diagnosis not present

## 2015-01-22 DIAGNOSIS — I10 Essential (primary) hypertension: Secondary | ICD-10-CM | POA: Diagnosis not present

## 2015-01-22 MED ORDER — CEFTRIAXONE SODIUM 250 MG IJ SOLR
250.0000 mg | Freq: Once | INTRAMUSCULAR | Status: AC
  Administered 2015-01-22: 250 mg via INTRAMUSCULAR
  Filled 2015-01-22: qty 250

## 2015-01-22 MED ORDER — AZITHROMYCIN 250 MG PO TABS
1000.0000 mg | ORAL_TABLET | Freq: Once | ORAL | Status: AC
  Administered 2015-01-22: 1000 mg via ORAL
  Filled 2015-01-22: qty 4

## 2015-01-22 MED ORDER — CEFTRIAXONE SODIUM 250 MG IJ SOLR: 250.0000 mg | Freq: Once | INTRAMUSCULAR | Status: DC

## 2015-01-22 MED ORDER — AZITHROMYCIN 250 MG PO TABS: 1000.0000 mg | ORAL_TABLET | Freq: Once | ORAL | Status: DC

## 2015-01-22 NOTE — ED Provider Notes (Signed)
CSN: [id number]     Arrival date & time 01/22/15  2258 History   First MD Initiated Contact with Patient 01/22/15 2300     Chief Complaint  Patient presents with  . Exposure to STD      HPI  Expand All Collapse All   Patient is here with Boyfriend who is being dx with STD. Patient would like to be treated as well.       Patient currently has no symptoms.  Past Medical History  Diagnosis Date  . Hypertension   . CHF (congestive heart failure)   . Heart valve problem   . High cholesterol    History reviewed. No pertinent past surgical history. History reviewed. No pertinent family history. History  Substance Use Topics  . Smoking status: Current Every Day Smoker -- 1.50 packs/day    Types: Cigarettes  . Smokeless tobacco: Not on file  . Alcohol Use: Yes     Comment: social   OB History    Gravida Para Term Preterm AB TAB SAB Ectopic Multiple Living   0 2 2 0 0 0 3     Review of Systems  All other systems reviewed and are negative  Allergies  Review of patient's allergies indicates no known allergies.  Home Medications   Prior to Admission medications   Medication Sig Start Date End Date Taking? Authorizing Provider  amLODipine (NORVASC) 10 MG tablet Take 10 mg by mouth daily.    Historical Provider, MD  aspirin 81 MG tablet Take 81 mg by mouth daily.    Historical Provider, MD  cromolyn (OPTICROM) 4 % ophthalmic solution Place 1 drop into both eyes 4 (four) times daily. 07/20/14   Tyrone Nine, MD  cyclobenzaprine (FLEXERIL) 10 MG tablet Take 1 tablet (10 mg total) by mouth 3 (three) times daily. 01/07/14   Elpidio Anis, PA-C  cyclobenzaprine (FLEXERIL) 10 MG tablet Take 1 tablet (10 mg total) by mouth 2 (two) times daily as needed for muscle spasms. 08/28/14   Mirian Mo, MD  docusate sodium (COLACE) 100 MG capsule Take 1 capsule (100 mg total) by mouth every 12 (twelve) hours. 12/12/12   Glynn Octave, MD  furosemide (LASIX) 20 MG tablet Take 20 mg by  mouth.    Historical Provider, MD  hydrochlorothiazide (HYDRODIURIL) 25 MG tablet Take 1 tablet (25 mg total) by mouth daily. 07/12/12   Gwyneth Sprout, MD  HYDROcodone-acetaminophen (NORCO/VICODIN) 5-325 MG per tablet Take 1-2 tablets by mouth every 4 (four) hours as needed for moderate pain. 01/07/14   Elpidio Anis, PA-C  HYDROcodone-acetaminophen (NORCO/VICODIN) 5-325 MG per tablet Take 1 tablet by mouth every 6 (six) hours as needed for severe pain. 04/14/14   Gerhard Munch, MD  hydrocortisone (ANUSOL-HC) 2.5 % rectal cream Apply rectally 2 times daily 12/12/12   Glynn Octave, MD  hydrOXYzine (ATARAX/VISTARIL) 25 MG tablet Take 1 tablet (25 mg total) by mouth every 6 (six) hours. 08/16/12   Elson Areas, PA-C  ibuprofen (ADVIL,MOTRIN) 800 MG tablet Take 1 tablet (800 mg total) by mouth 3 (three) times daily. 01/07/14   Elpidio Anis, PA-C  lisinopril (PRINIVIL,ZESTRIL) 20 MG tablet Take 20 mg by mouth daily.    Historical Provider, MD  LORazepam (ATIVAN) 1 MG tablet Take 1 tablet (1 mg total) by mouth every 8 (eight) hours. 08/16/12   Elson Areas, PA-C  methyldopa (ALDOMET) 500 MG tablet Take 500 mg by mouth 3 (three) times daily.    Historical Provider,  MD  metoprolol succinate (TOPROL-XL) 100 MG 24 hr tablet Take 100 mg by mouth daily. Take with or immediately following a meal.    Historical Provider, MD  metroNIDAZOLE (FLAGYL) 500 MG tablet Take 1 tablet (500 mg total) by mouth 2 (two) times daily. 12/12/12   Glynn OctaveStephen Rancour, MD  ondansetron (ZOFRAN) 4 MG tablet Take 1 tablet (4 mg total) by mouth every 6 (six) hours. 12/12/12   Glynn OctaveStephen Rancour, MD  ondansetron (ZOFRAN-ODT) 4 MG disintegrating tablet Take 4 mg by mouth every 8 (eight) hours as needed for nausea.    Historical Provider, MD  permethrin (ELIMITE) 5 % cream Apply to affected area once. Go to bed at night after you've apply the cream to wash off in the morning. If you're still itching in one week reapply a second time 07/25/12    Elson AreasLeslie K Sofia, PA-C  permethrin Verner Mould(ELIMITE) 5 % cream Apply to affected area once 08/16/12   Elson AreasLeslie K Sofia, PA-C  Prenatal Vit-Fe Fumarate-FA (PRENATAL MULTIVITAMIN) TABS Take 1 tablet by mouth daily at 12 noon.    Historical Provider, MD  trimethoprim-polymyxin b (POLYTRIM) ophthalmic solution Place 1 drop into the right eye every 4 (four) hours. 07/22/14   Wynetta EmeryNicole Pisciotta, PA-C   LMP 12/23/2014 (Approximate) Physical Exam Physical Exam  Nursing note and vitals reviewed. Constitutional: She is oriented to person, place, and time. She appears well-developed and well-nourished. No distress.  HENT:  Head: Normocephalic and atraumatic.  Eyes: Pupils are equal, round, and reactive to light.  Neck: Normal range of motion.  Cardiovascular: Normal rate and intact distal pulses.   Pulmonary/Chest: No respiratory distress.  Abdominal: Normal appearance. She exhibits no distension.  Musculoskeletal: Normal range of motion.  Neurological: She is alert and oriented to person, place, and time. No cranial nerve deficit.  Skin: Skin is warm and dry. No rash noted.  Psychiatric: She has a normal mood and affect. Her behavior is normal.   ED Course  Procedures (including critical care time) Medications  cefTRIAXone (ROCEPHIN) injection 250 mg (250 mg Intramuscular Given 01/22/15 2309)  azithromycin (ZITHROMAX) tablet 1,000 mg (1,000 mg Oral Given 01/22/15 2309)    Labs Review Labs Reviewed - No data to display    MDM   Final diagnoses:  Potential exposure to STD        Nelva Nayobert Axtyn Woehler, MD 01/30/15 [phone]

## 2015-01-22 NOTE — Discharge Instructions (Signed)

## 2015-01-22 NOTE — ED Notes (Signed)
Patient is here with Boyfriend who is being dx with STD. Patient would like to be treated as well.

## 2016-07-12 ENCOUNTER — Emergency Department (HOSPITAL_BASED_OUTPATIENT_CLINIC_OR_DEPARTMENT_OTHER): Payer: Medicaid Other

## 2016-07-12 ENCOUNTER — Emergency Department (HOSPITAL_BASED_OUTPATIENT_CLINIC_OR_DEPARTMENT_OTHER)
Admission: EM | Admit: 2016-07-12 | Discharge: 2016-07-12 | Disposition: A | Discharge status: Home / Self Care | Payer: Medicaid Other | Attending: Emergency Medicine | Admitting: Emergency Medicine

## 2016-07-12 ENCOUNTER — Encounter (HOSPITAL_BASED_OUTPATIENT_CLINIC_OR_DEPARTMENT_OTHER): Payer: Self-pay | Admitting: Emergency Medicine

## 2016-07-12 DIAGNOSIS — Z79899 Other long term (current) drug therapy: Secondary | ICD-10-CM | POA: Insufficient documentation

## 2016-07-12 DIAGNOSIS — J181 Lobar pneumonia, unspecified organism: Secondary | ICD-10-CM | POA: Insufficient documentation

## 2016-07-12 DIAGNOSIS — F17210 Nicotine dependence, cigarettes, uncomplicated: Secondary | ICD-10-CM | POA: Insufficient documentation

## 2016-07-12 DIAGNOSIS — I110 Hypertensive heart disease with heart failure: Secondary | ICD-10-CM | POA: Diagnosis not present

## 2016-07-12 DIAGNOSIS — R197 Diarrhea, unspecified: Secondary | ICD-10-CM | POA: Insufficient documentation

## 2016-07-12 DIAGNOSIS — J189 Pneumonia, unspecified organism: Secondary | ICD-10-CM

## 2016-07-12 DIAGNOSIS — R0981 Nasal congestion: Secondary | ICD-10-CM | POA: Diagnosis present

## 2016-07-12 DIAGNOSIS — I509 Heart failure, unspecified: Secondary | ICD-10-CM | POA: Insufficient documentation

## 2016-07-12 MED ORDER — LEVOFLOXACIN 750 MG PO TABS: 750.0000 mg | ORAL_TABLET | Freq: Every day | ORAL | 0 refills | Status: DC

## 2016-07-12 MED ORDER — BENZONATATE 100 MG PO CAPS
100.0000 mg | ORAL_CAPSULE | Freq: Once | ORAL | Status: AC
  Administered 2016-07-12: 100 mg via ORAL
  Filled 2016-07-12: qty 1

## 2016-07-12 MED ORDER — BENZONATATE 100 MG PO CAPS: 100.0000 mg | ORAL_CAPSULE | Freq: Three times a day (TID) | ORAL | 0 refills | Status: DC

## 2016-07-12 MED ORDER — ALBUTEROL SULFATE HFA 108 (90 BASE) MCG/ACT IN AERS
2.0000 | INHALATION_SPRAY | RESPIRATORY_TRACT | Status: DC | PRN
  Administered 2016-07-12: 2 via RESPIRATORY_TRACT
  Filled 2016-07-12: qty 6.7

## 2016-07-12 NOTE — ED Triage Notes (Addendum)
Patient reports congestion for the past 2 weeks.  States "I feel like I have a sinus infection".  Reports headache, nasal drainage.  States also coughing up "yellow mucus".  BP203/124 HX of HTN.  States out of blood pressure medication since 2 days ago due to lack of financial resources.

## 2016-07-12 NOTE — ED Provider Notes (Signed)
[facility] Provider Note   CSN: [id number] Arrival date & time: 07/12/16  1520  By signing my name below, I, Rosario Adie, attest that this documentation has been prepared under the direction and in the presence of Fayrene Helper, PA-C.  Electronically Signed: Rosario Adie, ED Scribe. 07/12/16. 4:37 PM.  History   Chief Complaint Chief Complaint  Patient presents with  . Nasal Congestion   The history is provided by the patient. No language interpreter was used.    HPI Comments: Kendahl Bumgardner is a 40 y.o. female with a PMHx of CHF, who presents to the Emergency Department complaining of unchanged, persistent nasal congestion onset approximately 2 weeks ago. She reports associated productive cough w/ green-yellow sputum, shortness of breath, frontal headache, rhinorrhea, sinus pressure/pain, and diarrhea secondary to her nasal congestion. Pt has been taking Tylenol, Robitussin, and using several home remedies with onions which has given her minimal relief of her symptoms. No h/o asthma or COPD. Pt did not have an influenza vaccination this year. Her daughter was previously sick with similar symptoms. Denies fever, CP, or any other associated symptoms.   Past Medical History:  Diagnosis Date  . CHF (congestive heart failure) (HCC)   . Heart valve problem   . High cholesterol   . Hypertension    There are no active problems to display for this patient.  Past Surgical History:  Procedure Laterality Date  . CESAREAN SECTION     OB History    Gravida Para Term Preterm AB Living   6 3 3  0 2 3   SAB TAB Ectopic Multiple Live Births   0 2 0 0       Home Medications    Prior to Admission medications   Medication Sig Start Date End Date Taking? Authorizing Provider  CARVEDILOL PO Take 12.5 mg by mouth 2 (two) times daily.    Yes Historical Provider, MD  amLODipine (NORVASC) 10 MG tablet Take 10 mg by mouth daily.    Historical Provider, MD  aspirin 81 MG  tablet Take 81 mg by mouth daily.    Historical Provider, MD  cromolyn (OPTICROM) 4 % ophthalmic solution Place 1 drop into both eyes 4 (four) times daily. 07/20/14   Tyrone Nine, MD  cyclobenzaprine (FLEXERIL) 10 MG tablet Take 1 tablet (10 mg total) by mouth 3 (three) times daily. 01/07/14   Elpidio Anis, PA-C  cyclobenzaprine (FLEXERIL) 10 MG tablet Take 1 tablet (10 mg total) by mouth 2 (two) times daily as needed for muscle spasms. 08/28/14   Mirian Mo, MD  docusate sodium (COLACE) 100 MG capsule Take 1 capsule (100 mg total) by mouth every 12 (twelve) hours. 12/12/12   Glynn Octave, MD  furosemide (LASIX) 20 MG tablet Take 20 mg by mouth.    Historical Provider, MD  hydrochlorothiazide (HYDRODIURIL) 25 MG tablet Take 1 tablet (25 mg total) by mouth daily. 07/12/12   Gwyneth Sprout, MD  HYDROcodone-acetaminophen (NORCO/VICODIN) 5-325 MG per tablet Take 1-2 tablets by mouth every 4 (four) hours as needed for moderate pain. 01/07/14   Elpidio Anis, PA-C  HYDROcodone-acetaminophen (NORCO/VICODIN) 5-325 MG per tablet Take 1 tablet by mouth every 6 (six) hours as needed for severe pain. 04/14/14   Gerhard Munch, MD  hydrocortisone (ANUSOL-HC) 2.5 % rectal cream Apply rectally 2 times daily 12/12/12   Glynn Octave, MD  hydrOXYzine (ATARAX/VISTARIL) 25 MG tablet Take 1 tablet (25 mg total) by mouth every 6 (six) hours. 08/16/12  Elson AreasLeslie K Sofia, PA-C  ibuprofen (ADVIL,MOTRIN) 800 MG tablet Take 1 tablet (800 mg total) by mouth 3 (three) times daily. 01/07/14   Elpidio AnisShari Upstill, PA-C  lisinopril (PRINIVIL,ZESTRIL) 20 MG tablet Take 20 mg by mouth daily.    Historical Provider, MD  LORazepam (ATIVAN) 1 MG tablet Take 1 tablet (1 mg total) by mouth every 8 (eight) hours. 08/16/12   Elson AreasLeslie K Sofia, PA-C  methyldopa (ALDOMET) 500 MG tablet Take 500 mg by mouth 3 (three) times daily.    Historical Provider, MD  metoprolol succinate (TOPROL-XL) 100 MG 24 hr tablet Take 100 mg by mouth daily. Take with  or immediately following a meal.    Historical Provider, MD  metroNIDAZOLE (FLAGYL) 500 MG tablet Take 1 tablet (500 mg total) by mouth 2 (two) times daily. 12/12/12   Glynn OctaveStephen Rancour, MD  ondansetron (ZOFRAN) 4 MG tablet Take 1 tablet (4 mg total) by mouth every 6 (six) hours. 12/12/12   Glynn OctaveStephen Rancour, MD  ondansetron (ZOFRAN-ODT) 4 MG disintegrating tablet Take 4 mg by mouth every 8 (eight) hours as needed for nausea.    Historical Provider, MD  permethrin (ELIMITE) 5 % cream Apply to affected area once. Go to bed at night after you've apply the cream to wash off in the morning. If you're still itching in one week reapply a second time 07/25/12   Elson AreasLeslie K Sofia, PA-C  permethrin Verner Mould(ELIMITE) 5 % cream Apply to affected area once 08/16/12   Elson AreasLeslie K Sofia, PA-C  Prenatal Vit-Fe Fumarate-FA (PRENATAL MULTIVITAMIN) TABS Take 1 tablet by mouth daily at 12 noon.    Historical Provider, MD  trimethoprim-polymyxin b (POLYTRIM) ophthalmic solution Place 1 drop into the right eye every 4 (four) hours. 07/22/14   Joni ReiningNicole Pisciotta, PA-C   Family History History reviewed. No pertinent family history.  Social History Social History  Substance Use Topics  . Smoking status: Current Every Day Smoker    Packs/day: 0.50    Types: Cigarettes  . Smokeless tobacco: Never Used  . Alcohol use Yes     Comment: social   Allergies   Patient has no known allergies.  Review of Systems Review of Systems  Constitutional: Negative for fever.  HENT: Positive for congestion, rhinorrhea, sinus pain and sinus pressure.   Respiratory: Positive for cough and shortness of breath.   Cardiovascular: Negative for chest pain.  Gastrointestinal: Positive for diarrhea.  Neurological: Positive for headaches (frontal).   Physical Exam Updated Vital Signs BP (!) 203/124 (BP Location: Left Arm)   Pulse 109   Temp 98.4 F (36.9 C) (Oral)   Resp 21   Ht 5\' 11"  (1.803 m)   Wt (!) 385 lb (174.6 kg)   LMP 07/12/2016  (Approximate)   SpO2 96%   BMI 53.70 kg/m   Physical Exam  Constitutional: She appears well-developed and well-nourished. No distress.  HENT:  Head: Normocephalic and atraumatic.  Right Ear: Tympanic membrane and external ear normal.  Left Ear: Tympanic membrane and external ear normal.  Nose: Nose normal.  Mouth/Throat: Oropharynx is clear and moist. No oropharyngeal exudate.  Eyes: Conjunctivae are normal.  Neck: Normal range of motion. Neck supple.  No nuchal rigidity.  Cardiovascular: Normal rate, regular rhythm and normal heart sounds.   No murmur heard. Pulmonary/Chest: Effort normal. She has wheezes (faint expiratory).  Scattered rhonchi on exam.   Abdominal: Soft. She exhibits no distension. There is no tenderness.  Musculoskeletal: Normal range of motion.  Lymphadenopathy:    She has  no cervical adenopathy.  Neurological: She is alert.  Skin: No pallor.  Psychiatric: She has a normal mood and affect. Her behavior is normal.  Nursing note and vitals reviewed.  ED Treatments / Results  DIAGNOSTIC STUDIES: Oxygen Saturation is 96% on RA, normal by my interpretation.   COORDINATION OF CARE: 4:23 PM-Discussed next steps with pt. Pt verbalized understanding and is agreeable with the plan.   Radiology Dg Chest 2 View  Result Date: 07/12/2016 CLINICAL DATA:  Cough EXAM: CHEST  2 VIEW COMPARISON:  07/21/2015 chest radiograph. FINDINGS: Stable cardiomediastinal silhouette with mild cardiomegaly. No pneumothorax. No pleural effusion. Patchy left lower lobe consolidation. No overt pulmonary edema. IMPRESSION: 1. Patchy left lower lobe consolidation, suspicious for pneumonia. Recommend follow-up PA and lateral post treatment chest radiographs in 4-6 weeks. 2. Stable mild cardiomegaly without overt pulmonary edema. Electronically Signed   By: Delbert PhenixJason A Poff M.D.   On: 07/12/2016 16:45   Procedures Procedures   Medications Ordered in ED Medications  benzonatate (TESSALON)  capsule 100 mg (100 mg Oral Given 07/12/16 1644)   Initial Impression / Assessment and Plan / ED Course  I have reviewed the triage vital signs and the nursing notes.  Pertinent labs & imaging results that were available during my care of the patient were reviewed by me and considered in my medical decision making (see chart for details).  Clinical Course    Pt is a 40yo female who presents into the ED with URI-like symptoms ongoing over the past two weeks. Exam revealed scattered rhonchi and faint wheezes. CXR is remarkable for patchy left lower lobe consolidation, suspicious for pneumonia, as read by radiology. Tessalon was given in the ED for some symptomatic therapy. Will d/c home with rx for Levaquin, Tessalon pearls, and an albuterol inhaler. Counseled pt that she needs to be seen by her PCP in 4-6 weeks to have f/u radiographs performed to ensure complete clear up of infection. Pt is comfortable with above plan and is stable for discharge at this time. All questions were answered prior to disposition. Strict return precautions for return into the ED were discussed.   Final Clinical Impressions(s) / ED Diagnoses   Final diagnoses:  Community acquired pneumonia of left lower lobe of lung (HCC)   New Prescriptions New Prescriptions   BENZONATATE (TESSALON) 100 MG CAPSULE    Take 1 capsule (100 mg total) by mouth every 8 (eight) hours.   LEVOFLOXACIN (LEVAQUIN) 750 MG TABLET    Take 1 tablet (750 mg total) by mouth daily. X 7 days   I personally performed the services described in this documentation, which was scribed in my presence. The recorded information has been reviewed and is accurate.       Fayrene HelperBowie Zailyn Thoennes, PA-C 07/12/16 1726    Alvira MondayErin Schlossman, MD 07/13/16 2120

## 2016-07-12 NOTE — ED Notes (Signed)
Pt given d/c instructions as per chart. Rx x 2. Verbalizes understanding. No questions. 

## 2016-07-12 NOTE — ED Notes (Signed)
Pt states she has had cold s/s x 2 weeks. Has tried OTC meds and home remedies without relief.

## 2016-07-12 NOTE — Discharge Instructions (Signed)
Use albuterol inhaler 2 puffs every 4 hrs as needed for shortness of breath.  Take antibiotic for the full duration.  Follow up with your doctor for further care .

## 2016-07-12 NOTE — ED Notes (Signed)
Nicky, RRT in to assess.

## 2016-09-16 ENCOUNTER — Encounter (HOSPITAL_BASED_OUTPATIENT_CLINIC_OR_DEPARTMENT_OTHER): Payer: Self-pay | Admitting: *Deleted

## 2016-09-16 ENCOUNTER — Emergency Department (HOSPITAL_BASED_OUTPATIENT_CLINIC_OR_DEPARTMENT_OTHER)
Admission: EM | Admit: 2016-09-16 | Discharge: 2016-09-16 | Disposition: A | Discharge status: Home / Self Care | Payer: Medicaid Other | Attending: Emergency Medicine | Admitting: Emergency Medicine

## 2016-09-16 ENCOUNTER — Emergency Department (HOSPITAL_BASED_OUTPATIENT_CLINIC_OR_DEPARTMENT_OTHER): Payer: Medicaid Other

## 2016-09-16 DIAGNOSIS — I509 Heart failure, unspecified: Secondary | ICD-10-CM | POA: Diagnosis not present

## 2016-09-16 DIAGNOSIS — Z79899 Other long term (current) drug therapy: Secondary | ICD-10-CM | POA: Insufficient documentation

## 2016-09-16 DIAGNOSIS — R0602 Shortness of breath: Secondary | ICD-10-CM | POA: Diagnosis present

## 2016-09-16 DIAGNOSIS — F17210 Nicotine dependence, cigarettes, uncomplicated: Secondary | ICD-10-CM | POA: Diagnosis not present

## 2016-09-16 DIAGNOSIS — I110 Hypertensive heart disease with heart failure: Secondary | ICD-10-CM | POA: Diagnosis not present

## 2016-09-16 LAB — BASIC METABOLIC PANEL
ANION GAP: 5 (ref 5–15)
BUN: 8 mg/dL (ref 6–20)
CALCIUM: 8.2 mg/dL — AB (ref 8.9–10.3)
CHLORIDE: 105 mmol/L (ref 101–111)
CO2: 26 mmol/L (ref 22–32)
Creatinine, Ser: 0.93 mg/dL (ref 0.44–1.00)
GFR calc non Af Amer: >60 mL/min (ref >60)
Glucose, Bld: 102 mg/dL — ABNORMAL HIGH (ref 65–99)
Potassium: 3.8 mmol/L (ref 3.5–5.1)
Sodium: 136 mmol/L (ref 135–145)

## 2016-09-16 LAB — CBC
HEMATOCRIT: 29.8 % — AB (ref 36.0–46.0)
HEMOGLOBIN: 9.1 g/dL — AB (ref 12.0–15.0)
MCH: 21.8 pg — ABNORMAL LOW (ref 26.0–34.0)
MCHC: 30.5 g/dL (ref 30.0–36.0)
MCV: 71.5 fL — ABNORMAL LOW (ref 78.0–100.0)
Platelets: 341 10*3/uL (ref 150–400)
RBC: 4.17 MIL/uL (ref 3.87–5.11)
RDW: 17.4 % — AB (ref 11.5–15.5)
WBC: 9 10*3/uL (ref 4.0–10.5)

## 2016-09-16 LAB — TROPONIN I: Troponin I: <0.03 ng/mL (ref <0.03)

## 2016-09-16 LAB — BRAIN NATRIURETIC PEPTIDE: B NATRIURETIC PEPTIDE 5: 143 pg/mL — AB (ref 0.0–100.0)

## 2016-09-16 MED ORDER — CARVEDILOL 25 MG PO TABS: 25.0000 mg | ORAL_TABLET | Freq: Two times a day (BID) | ORAL | 0 refills | Status: AC

## 2016-09-16 MED ORDER — POTASSIUM CHLORIDE ER 10 MEQ PO TBCR: 10.0000 meq | EXTENDED_RELEASE_TABLET | Freq: Every day | ORAL | 0 refills | Status: DC

## 2016-09-16 MED ORDER — NIFEDIPINE ER 60 MG PO TB24: 60.0000 mg | ORAL_TABLET | Freq: Every day | ORAL | 0 refills | Status: AC

## 2016-09-16 MED ORDER — FUROSEMIDE 10 MG/ML IJ SOLN
40.0000 mg | Freq: Once | INTRAMUSCULAR | Status: AC
  Administered 2016-09-16: 40 mg via INTRAVENOUS
  Filled 2016-09-16: qty 4

## 2016-09-16 MED ORDER — ACETAMINOPHEN 500 MG PO TABS
1000.0000 mg | ORAL_TABLET | Freq: Once | ORAL | Status: AC
  Administered 2016-09-16: 1000 mg via ORAL
  Filled 2016-09-16: qty 2

## 2016-09-16 MED ORDER — FUROSEMIDE 40 MG PO TABS: 40.0000 mg | ORAL_TABLET | Freq: Every day | ORAL | 0 refills | Status: DC

## 2016-09-16 MED FILL — POTASSIUM CL 10 MEQ TAB SA: 10 | 30 days supply | Qty: 30 | Fill #0

## 2016-09-16 MED FILL — FUROSEMIDE 40 MG TABLET: 40 | 30 days supply | Qty: 30 | Fill #0

## 2016-09-16 MED FILL — NIFEDIPINE ER 60 MG TABLET: 60 | 30 days supply | Qty: 30 | Fill #0

## 2016-09-16 NOTE — ED Triage Notes (Signed)
Pt reports onset of cp and sob with increased doe x 2 days, states she has a hx of chf and "this feels like my heart." ekg performed at triage and given to dr. Karma GanjaLinker by crystal, rt. Pt is smiling and alert in nad.

## 2016-09-16 NOTE — Discharge Instructions (Signed)
Return to the ED with any concerns including difficulty breathing, increased swelling, fainting, chest pain, decreased level of alertness/lethargy, or any other alarming symptoms   I spoke to your cardiologist who recommended restarting lasix at 40mg  daily, increase your carvedilol to 25mg  twice daily, nifedipine ER 60mg  daily, potassium chloride 10mEq daily

## 2016-09-16 NOTE — ED Provider Notes (Signed)
[facility]   CSN: [id number] Arrival date & time: 09/16/16  1043     History   Chief Complaint Chief Complaint  Patient presents with  . Chest Pain    HPI Gabriela Thompson is a 41 y.o. female.  HPI  Pt presenting with c/o shortness of breath on exertion.  Symptoms have been worsening over the past 3 weeks since being told to stop taking her lasix.  She states her cardiologist called her and told her to stop lasix due to her kidney function.  She feels her legs and face are more swollen.  She has difficulty lying flat at night and has had trouble sleeping- feels like she is "smothering".  Has shortness of breath with walking a few feet.  There are no other associated systemic symptoms, there are no other alleviating or modifying factors. She states she has run out of her vitamin D but has been taking all other meds as prescribed.  No fever, no change in chronic cough.    Past Medical History:  Diagnosis Date  . CHF (congestive heart failure) (HCC)   . Heart valve problem   . High cholesterol   . Hypertension     There are no active problems to display for this patient.   Past Surgical History:  Procedure Laterality Date  . CESAREAN SECTION      OB History    Gravida Para Term Preterm AB Living   6 3 3  0 2 3   SAB TAB Ectopic Multiple Live Births   0 2 0 0         Home Medications    Prior to Admission medications   Medication Sig Start Date End Date Taking? Authorizing Provider  Vitamin D, Ergocalciferol, (DRISDOL) 50000 units CAPS capsule Take 50,000 Units by mouth every 7 (seven) days.   Yes Historical Provider, MD  amLODipine (NORVASC) 10 MG tablet Take 10 mg by mouth daily.    Historical Provider, MD  aspirin 81 MG tablet Take 81 mg by mouth daily.    Historical Provider, MD  benzonatate (TESSALON) 100 MG capsule Take 1 capsule (100 mg total) by mouth every 8 (eight) hours. 07/12/16   Fayrene Helper, PA-C  carvedilol (COREG) 25 MG tablet  Take 1 tablet (25 mg total) by mouth 2 (two) times daily with a meal. 09/16/16   Jerelyn Scott, MD  cromolyn (OPTICROM) 4 % ophthalmic solution Place 1 drop into both eyes 4 (four) times daily. 07/20/14   Tyrone Nine, MD  cyclobenzaprine (FLEXERIL) 10 MG tablet Take 1 tablet (10 mg total) by mouth 3 (three) times daily. 01/07/14   Elpidio Anis, PA-C  cyclobenzaprine (FLEXERIL) 10 MG tablet Take 1 tablet (10 mg total) by mouth 2 (two) times daily as needed for muscle spasms. 08/28/14   Mirian Mo, MD  docusate sodium (COLACE) 100 MG capsule Take 1 capsule (100 mg total) by mouth every 12 (twelve) hours. 12/12/12   Glynn Octave, MD  furosemide (LASIX) 40 MG tablet Take 1 tablet (40 mg total) by mouth daily. 09/16/16   Jerelyn Scott, MD  hydrochlorothiazide (HYDRODIURIL) 25 MG tablet Take 1 tablet (25 mg total) by mouth daily. 07/12/12   Gwyneth Sprout, MD  HYDROcodone-acetaminophen (NORCO/VICODIN) 5-325 MG per tablet Take 1-2 tablets by mouth every 4 (four) hours as needed for moderate pain. 01/07/14   Elpidio Anis, PA-C  HYDROcodone-acetaminophen (NORCO/VICODIN) 5-325 MG per tablet Take 1 tablet by mouth every 6 (six) hours as needed for  severe pain. 04/14/14   Gerhard Munch, MD  hydrocortisone (ANUSOL-HC) 2.5 % rectal cream Apply rectally 2 times daily 12/12/12   Glynn Octave, MD  hydrOXYzine (ATARAX/VISTARIL) 25 MG tablet Take 1 tablet (25 mg total) by mouth every 6 (six) hours. 08/16/12   Elson Areas, PA-C  ibuprofen (ADVIL,MOTRIN) 800 MG tablet Take 1 tablet (800 mg total) by mouth 3 (three) times daily. 01/07/14   Elpidio Anis, PA-C  levofloxacin (LEVAQUIN) 750 MG tablet Take 1 tablet (750 mg total) by mouth daily. X 7 days 07/12/16   Fayrene Helper, PA-C  lisinopril (PRINIVIL,ZESTRIL) 20 MG tablet Take 20 mg by mouth daily.    Historical Provider, MD  LORazepam (ATIVAN) 1 MG tablet Take 1 tablet (1 mg total) by mouth every 8 (eight) hours. 08/16/12   Elson Areas, PA-C  methyldopa  (ALDOMET) 500 MG tablet Take 500 mg by mouth 3 (three) times daily.    Historical Provider, MD  metoprolol succinate (TOPROL-XL) 100 MG 24 hr tablet Take 100 mg by mouth daily. Take with or immediately following a meal.    Historical Provider, MD  metroNIDAZOLE (FLAGYL) 500 MG tablet Take 1 tablet (500 mg total) by mouth 2 (two) times daily. 12/12/12   Glynn Octave, MD  NIFEdipine (PROCARDIA-XL/ADALAT CC) 60 MG 24 hr tablet Take 1 tablet (60 mg total) by mouth daily. 09/16/16   Jerelyn Scott, MD  ondansetron (ZOFRAN) 4 MG tablet Take 1 tablet (4 mg total) by mouth every 6 (six) hours. 12/12/12   Glynn Octave, MD  ondansetron (ZOFRAN-ODT) 4 MG disintegrating tablet Take 4 mg by mouth every 8 (eight) hours as needed for nausea.    Historical Provider, MD  permethrin (ELIMITE) 5 % cream Apply to affected area once. Go to bed at night after you've apply the cream to wash off in the morning. If you're still itching in one week reapply a second time 07/25/12   Elson Areas, PA-C  permethrin Verner Mould) 5 % cream Apply to affected area once 08/16/12   Elson Areas, PA-C  potassium chloride (K-DUR) 10 MEQ tablet Take 1 tablet (10 mEq total) by mouth daily. 09/16/16   Jerelyn Scott, MD  Prenatal Vit-Fe Fumarate-FA (PRENATAL MULTIVITAMIN) TABS Take 1 tablet by mouth daily at 12 noon.    Historical Provider, MD  trimethoprim-polymyxin b (POLYTRIM) ophthalmic solution Place 1 drop into the right eye every 4 (four) hours. 07/22/14   Joni Reining Pisciotta, PA-C    Family History History reviewed. No pertinent family history.  Social History Social History  Substance Use Topics  . Smoking status: Current Every Day Smoker    Packs/day: 0.50    Types: Cigarettes  . Smokeless tobacco: Never Used  . Alcohol use Yes     Comment: social     Allergies   Patient has no known allergies.   Review of Systems Review of Systems  ROS reviewed and all otherwise negative except for mentioned in HPI   Physical  Exam Updated Vital Signs BP (!) 173/103   Pulse 80   Temp 98.5 F (36.9 C) (Oral)   Resp 16   Ht 5\' 11"  (1.803 m)   Wt (!) 385 lb (174.6 kg)   LMP 08/27/2016   SpO2 100%   BMI 53.70 kg/m  Vitals reviewed Physical Exam Physical Examination: General appearance - alert, well appearing, and in no distress Mental status - alert, oriented to person, place, and time Eyes - no conjunctival injection, no scleral icterus Mouth - mucous  membranes moist, pharynx normal without lesions Neck - supple, no significant adenopathy Chest - BSS, decreased air movement, no dyspnea, normal respiratory effort Heart - normal rate, regular rhythm, normal S1, S2, no murmurs, rubs, clicks or gallops Abdomen - soft, nontender, nondistended, no masses or organomegaly Neurological - alert, oriented, normal speech Extremities - peripheral pulses normal,2+ bilateral edema 1/2 way up anterior tibia bilaterally, no clubbing or cyanosis Skin - normal coloration and turgor, no rashes  ED Treatments / Results  Labs (all labs ordered are listed, but only abnormal results are displayed) Labs Reviewed  CBC - Abnormal; Notable for the following:       Result Value   Hemoglobin 9.1 (*)    HCT 29.8 (*)    MCV 71.5 (*)    MCH 21.8 (*)    RDW 17.4 (*)    All other components within normal limits  BASIC METABOLIC PANEL - Abnormal; Notable for the following:    Glucose, Bld 102 (*)    Calcium 8.2 (*)    All other components within normal limits  BRAIN NATRIURETIC PEPTIDE - Abnormal; Notable for the following:    B Natriuretic Peptide 143.0 (*)    All other components within normal limits  TROPONIN I    EKG  EKG Interpretation  Date/Time:  Wednesday September 16 2016 10:47:58 EST Ventricular Rate:  87 PR Interval:  206 QRS Duration: 86 QT Interval:  394 QTC Calculation: 474 R Axis:   91 Text Interpretation:  Normal sinus rhythm Possible Left atrial enlargement Rightward axis Borderline ECG Since previous  tracing axis has shifted rightward Confirmed by Karma GanjaLINKER  MD, MARTHA [id number] on 09/16/2016 10:53:16 AM       Radiology Dg Chest 2 View  Result Date: 09/16/2016 CLINICAL DATA:  Cough for 1 week, chest pain EXAM: CHEST  2 VIEW COMPARISON:  07/14/2016 FINDINGS: Cardiomegaly with vascular congestion. Suspect mild interstitial edema. No confluent opacities or effusions. No acute bony abnormality. IMPRESSION: Cardiomegaly with vascular congestion and possible early interstitial edema. Electronically Signed   By: Charlett NoseKevin  Dover M.D.   On: 09/16/2016 12:29    Procedures Procedures (including critical care time)  Medications Ordered in ED Medications  furosemide (LASIX) injection 40 mg (40 mg Intravenous Given 09/16/16 1411)  acetaminophen (TYLENOL) tablet 1,000 mg (1,000 mg Oral Given 09/16/16 1410)     Initial Impression / Assessment and Plan / ED Course  I have reviewed the triage vital signs and the nursing notes.  Pertinent labs & imaging results that were available during my care of the patient were reviewed by me and considered in my medical decision making (see chart for details).    2:19 PM d/w Arnette FeltsMike Duran, [facility]- he recommends restarting her lasix- her creatinine today is normal- recommends lasix 40mg  po qD, increase carvedilol to 25mg  po qD, nifedepine ER 60mg  qD (I do not see this on her med list- so will be sure she knows to take it), and KCL 10mEq daily.  Pt to f/u with [facility] next week.     Final Clinical Impressions(s) / ED Diagnoses   Final diagnoses:  Acute on chronic congestive heart failure, unspecified congestive heart failure type [facility](HCC)    New Prescriptions Discharge Medication List as of 09/16/2016  3:15 PM    START taking these medications   Details  NIFEdipine (PROCARDIA-XL/ADALAT CC) 60 MG 24 hr tablet Take 1 tablet (60 mg total) by mouth daily., Starting Wed 09/16/2016, Print    potassium chloride (  K-DUR) 10 MEQ tablet Take 1 tablet  (10 mEq total) by mouth daily., Starting Wed 09/16/2016, Print         Jerelyn Scott, MD 09/17/16 [phone]

## 2017-06-01 ENCOUNTER — Emergency Department (HOSPITAL_BASED_OUTPATIENT_CLINIC_OR_DEPARTMENT_OTHER)
Admission: EM | Admit: 2017-06-01 | Discharge: 2017-06-01 | Disposition: A | Discharge status: Home / Self Care | Payer: Medicaid Other | Attending: Emergency Medicine | Admitting: Emergency Medicine

## 2017-06-01 ENCOUNTER — Encounter (HOSPITAL_BASED_OUTPATIENT_CLINIC_OR_DEPARTMENT_OTHER): Payer: Self-pay | Admitting: Emergency Medicine

## 2017-06-01 DIAGNOSIS — I110 Hypertensive heart disease with heart failure: Secondary | ICD-10-CM | POA: Diagnosis not present

## 2017-06-01 DIAGNOSIS — I509 Heart failure, unspecified: Secondary | ICD-10-CM | POA: Insufficient documentation

## 2017-06-01 DIAGNOSIS — R21 Rash and other nonspecific skin eruption: Secondary | ICD-10-CM | POA: Diagnosis not present

## 2017-06-01 DIAGNOSIS — Z7982 Long term (current) use of aspirin: Secondary | ICD-10-CM | POA: Diagnosis not present

## 2017-06-01 DIAGNOSIS — Z79899 Other long term (current) drug therapy: Secondary | ICD-10-CM | POA: Insufficient documentation

## 2017-06-01 DIAGNOSIS — F17210 Nicotine dependence, cigarettes, uncomplicated: Secondary | ICD-10-CM | POA: Diagnosis not present

## 2017-06-01 DIAGNOSIS — L298 Other pruritus: Secondary | ICD-10-CM | POA: Diagnosis present

## 2017-06-01 MED ORDER — PERMETHRIN 5 % EX CREA: 1.0000 "application " | TOPICAL_CREAM | Freq: Once | CUTANEOUS | 0 refills | Status: AC

## 2017-06-01 MED FILL — PERMETHRIN 5% CREAM: 5 | 30 days supply | Qty: 60 | Fill #0

## 2017-06-01 NOTE — ED Provider Notes (Signed)
[facility] Provider Note   CSN: [id number] Arrival date & time: 06/01/17  1230     History   Chief Complaint Chief Complaint  Patient presents with  . Pruritis    HPI Gabriela Thompson is a 41 y.o. female.  HPI Gabriela Thompson is a 41 y.o. female presents to emergency department complaining of rash and itching. Patient has noticed itching several days ago that is mainly to her hands, but states at nighttime it's all over. She reports history of scabies and states this feels the same. She states that her daughter is with her today and has diffuse itching as well. She denies any new lotions, detergents, products. She denies pain and night of the hotels or so and also some home. She denies any foods or personal products. She has not taken any medications to help with her symptoms. She denies any fever or chills. Denies any contact with people with similar symptoms other than her daughter.   Past Medical History:  Diagnosis Date  . CHF (congestive heart failure) (HCC)   . Heart valve problem   . High cholesterol   . Hypertension     There are no active problems to display for this patient.   Past Surgical History:  Procedure Laterality Date  . CESAREAN SECTION      OB History    Gravida Para Term Preterm AB Living   0 2 3   SAB TAB Ectopic Multiple Live Births   0 2 0 0         Home Medications    Prior to Admission medications   Medication Sig Start Date End Date Taking? Authorizing Provider  amLODipine (NORVASC) 10 MG tablet Take 10 mg by mouth daily.    [provider]  aspirin 81 MG tablet Take 81 mg by mouth daily.    [provider]  benzonatate (TESSALON) 100 MG capsule Take 1 capsule (100 mg total) by mouth every 8 (eight) hours. 07/12/16   Fayrene Helper, PA-C  carvedilol (COREG) 25 MG tablet Take 1 tablet (25 mg total) by mouth 2 (two) times daily with a meal. 09/16/16   Mabe, Latanya Maudlin, MD  cromolyn (OPTICROM) 4 % ophthalmic  solution Place 1 drop into both eyes 4 (four) times daily. 07/20/14   Tyrone Nine, MD  cyclobenzaprine (FLEXERIL) 10 MG tablet Take 1 tablet (10 mg total) by mouth 3 (three) times daily. 01/07/14   Elpidio Anis, PA-C  cyclobenzaprine (FLEXERIL) 10 MG tablet Take 1 tablet (10 mg total) by mouth 2 (two) times daily as needed for muscle spasms. 08/28/14   Mirian Mo, MD  docusate sodium (COLACE) 100 MG capsule Take 1 capsule (100 mg total) by mouth every 12 (twelve) hours. 12/12/12   Rancour, Jeannett Senior, MD  furosemide (LASIX) 40 MG tablet Take 1 tablet (40 mg total) by mouth daily. 09/16/16   Mabe, Latanya Maudlin, MD  hydrochlorothiazide (HYDRODIURIL) 25 MG tablet Take 1 tablet (25 mg total) by mouth daily. 07/12/12   Gwyneth Sprout, MD  HYDROcodone-acetaminophen (NORCO/VICODIN) 5-325 MG per tablet Take 1-2 tablets by mouth every 4 (four) hours as needed for moderate pain. 01/07/14   Elpidio Anis, PA-C  HYDROcodone-acetaminophen (NORCO/VICODIN) 5-325 MG per tablet Take 1 tablet by mouth every 6 (six) hours as needed for severe pain. 04/14/14   Gerhard Munch, MD  hydrocortisone (ANUSOL-HC) 2.5 % rectal cream Apply rectally 2 times daily 12/12/12   Glynn Octave, MD  hydrOXYzine (ATARAX/VISTARIL) 25 MG tablet  Take 1 tablet (25 mg total) by mouth every 6 (six) hours. 08/16/12   Elson Areas, PA-C  ibuprofen (ADVIL,MOTRIN) 800 MG tablet Take 1 tablet (800 mg total) by mouth 3 (three) times daily. 01/07/14   Elpidio Anis, PA-C  levofloxacin (LEVAQUIN) 750 MG tablet Take 1 tablet (750 mg total) by mouth daily. X 7 days 07/12/16   Fayrene Helper, PA-C  lisinopril (PRINIVIL,ZESTRIL) 20 MG tablet Take 20 mg by mouth daily.    [provider]  LORazepam (ATIVAN) 1 MG tablet Take 1 tablet (1 mg total) by mouth every 8 (eight) hours. 08/16/12   Elson Areas, PA-C  methyldopa (ALDOMET) 500 MG tablet Take 500 mg by mouth 3 (three) times daily.    [provider]  metoprolol succinate  (TOPROL-XL) 100 MG 24 hr tablet Take 100 mg by mouth daily. Take with or immediately following a meal.    [provider]  metroNIDAZOLE (FLAGYL) 500 MG tablet Take 1 tablet (500 mg total) by mouth 2 (two) times daily. 12/12/12   Rancour, Jeannett Senior, MD  NIFEdipine (PROCARDIA-XL/ADALAT CC) 60 MG 24 hr tablet Take 1 tablet (60 mg total) by mouth daily. 09/16/16   Mabe, Latanya Maudlin, MD  ondansetron (ZOFRAN) 4 MG tablet Take 1 tablet (4 mg total) by mouth every 6 (six) hours. 12/12/12   Rancour, Jeannett Senior, MD  ondansetron (ZOFRAN-ODT) 4 MG disintegrating tablet Take 4 mg by mouth every 8 (eight) hours as needed for nausea.    [provider]  permethrin (ELIMITE) 5 % cream Apply to affected area once. Go to bed at night after you've apply the cream to wash off in the morning. If you're still itching in one week reapply a second time 07/25/12   Elson Areas, PA-C  permethrin (ELIMITE) 5 % cream Apply to affected area once 08/16/12   Elson Areas, PA-C  potassium chloride (K-DUR) 10 MEQ tablet Take 1 tablet (10 mEq total) by mouth daily. 09/16/16   Mabe, Latanya Maudlin, MD  Prenatal Vit-Fe Fumarate-FA (PRENATAL MULTIVITAMIN) TABS Take 1 tablet by mouth daily at 12 noon.    [provider]  trimethoprim-polymyxin b (POLYTRIM) ophthalmic solution Place 1 drop into the right eye every 4 (four) hours. 07/22/14   Pisciotta, Joni Reining, PA-C  Vitamin D, Ergocalciferol, (DRISDOL) 50000 units CAPS capsule Take 50,000 Units by mouth every 7 (seven) days.    [provider]    Family History History reviewed. No pertinent family history.  Social History Social History  Substance Use Topics  . Smoking status: Current Every Day Smoker    Packs/day: 0.50    Types: Cigarettes  . Smokeless tobacco: Never Used  . Alcohol use Yes     Comment: social     Allergies   Patient has no known allergies.   Review of Systems Review of Systems  Constitutional: Negative for chills and fever.    Respiratory: Negative for cough, chest tightness and shortness of breath.   Cardiovascular: Negative for chest pain, palpitations and leg swelling.  Gastrointestinal: Negative for abdominal pain, diarrhea, nausea and vomiting.  Genitourinary: Negative for dysuria, flank pain, pelvic pain, vaginal bleeding, vaginal discharge and vaginal pain.  Musculoskeletal: Negative for arthralgias, myalgias, neck pain and neck stiffness.  Skin: Positive for rash.  Neurological: Negative for dizziness, weakness and headaches.  All other systems reviewed and are negative.    Physical Exam Updated Vital Signs BP 119/75 (BP Location: Right Arm)   Pulse 94   Temp 99  F (37.2 C) (Oral)   Resp 18   Ht  (1.803 m)   Wt (!) 176.4 kg (389 lb)   LMP 05/25/2017   SpO2 98%   BMI 54.25 kg/m   Physical Exam  Constitutional: She is oriented to person, place, and time. She appears well-developed and well-nourished. No distress.  HENT:  Head: Normocephalic.  Eyes: Conjunctivae are normal.  Neck: Neck supple.  Cardiovascular: Normal rate, regular rhythm and normal heart sounds.   Pulmonary/Chest: Effort normal and breath sounds normal. No respiratory distress. She has no wheezes. She has no rales.  Abdominal: Soft. Bowel sounds are normal. She exhibits no distension. There is no tenderness. There is no rebound.  Musculoskeletal: She exhibits no edema.  Few papules to the fingers and hand, and webs of the fingers. No rash anywhere else on the body  Neurological: She is alert and oriented to person, place, and time.  Skin: Skin is warm and dry.  Psychiatric: She has a normal mood and affect. Her behavior is normal.  Nursing note and vitals reviewed.    ED Treatments / Results  Labs (all labs ordered are listed, but only abnormal results are displayed) Labs Reviewed - No data to display  EKG  EKG Interpretation None       Radiology No results found.  Procedures Procedures (including  critical care time)  Medications Ordered in ED Medications - No data to display   Initial Impression / Assessment and Plan / ED Course  I have reviewed the triage vital signs and the nursing notes.  Pertinent labs & imaging results that were available during my care of the patient were reviewed by me and considered in my medical decision making (see chart for details).     Patient in emergency department with itching, mainly to the wrists and hands. States feels like prior scabies. Her daughter is accompanying her with itching as well. Patient requesting treatment for possible scabies. Will treat with permethrin cream. Otherwise instructed to take Benadryl, most rising cream, follow-up with primary care doctor. Pt has no fever. No oral mucosal involvement. No other complaints. Non toxic appearing.   Vitals:   06/01/17 1241  BP: 119/75  Pulse: 94  Resp: 18  Temp: 99 F (37.2 C)  TempSrc: Oral  SpO2: 98%  Weight: (!) 176.4 kg (389 lb)  Height:  (1.803 m)     Final Clinical Impressions(s) / ED Diagnoses   Final diagnoses:  Rash    New Prescriptions New Prescriptions   PERMETHRIN (ELIMITE) 5 % CREAM    Apply 1 application topically once. Repeat in 2 weeks     Jaynie Crumble, PA-C 06/01/17 1347    Vanetta Mulders, MD 06/04/17 2332

## 2017-06-01 NOTE — ED Triage Notes (Signed)
patient states that she is having  Itching allover her body. Hx of scabies in the past and she states that this is similar

## 2017-06-01 NOTE — ED Notes (Signed)
ED Provider at bedside. 

## 2017-06-01 NOTE — Discharge Instructions (Signed)
Benadryl for itching. Apply permethrin cream neck down, wash off in 8 hrs. Repeat in 2 weeks. Follow up as needed.

## 2017-08-08 ENCOUNTER — Emergency Department (HOSPITAL_BASED_OUTPATIENT_CLINIC_OR_DEPARTMENT_OTHER)
Admission: EM | Admit: 2017-08-08 | Discharge: 2017-08-08 | Disposition: A | Discharge status: Home / Self Care | Payer: Medicaid Other | Attending: Emergency Medicine | Admitting: Emergency Medicine

## 2017-08-08 ENCOUNTER — Other Ambulatory Visit: Payer: Self-pay

## 2017-08-08 ENCOUNTER — Emergency Department (HOSPITAL_BASED_OUTPATIENT_CLINIC_OR_DEPARTMENT_OTHER): Payer: Medicaid Other

## 2017-08-08 ENCOUNTER — Encounter (HOSPITAL_BASED_OUTPATIENT_CLINIC_OR_DEPARTMENT_OTHER): Payer: Self-pay | Admitting: Emergency Medicine

## 2017-08-08 DIAGNOSIS — F17210 Nicotine dependence, cigarettes, uncomplicated: Secondary | ICD-10-CM | POA: Insufficient documentation

## 2017-08-08 DIAGNOSIS — M79605 Pain in left leg: Secondary | ICD-10-CM | POA: Diagnosis present

## 2017-08-08 DIAGNOSIS — Z7982 Long term (current) use of aspirin: Secondary | ICD-10-CM | POA: Insufficient documentation

## 2017-08-08 DIAGNOSIS — I110 Hypertensive heart disease with heart failure: Secondary | ICD-10-CM | POA: Insufficient documentation

## 2017-08-08 DIAGNOSIS — M7122 Synovial cyst of popliteal space [Baker], left knee: Secondary | ICD-10-CM | POA: Insufficient documentation

## 2017-08-08 DIAGNOSIS — Z79899 Other long term (current) drug therapy: Secondary | ICD-10-CM | POA: Insufficient documentation

## 2017-08-08 DIAGNOSIS — I509 Heart failure, unspecified: Secondary | ICD-10-CM | POA: Insufficient documentation

## 2017-08-08 DIAGNOSIS — Z791 Long term (current) use of non-steroidal anti-inflammatories (NSAID): Secondary | ICD-10-CM | POA: Diagnosis not present

## 2017-08-08 MED ORDER — KETOROLAC TROMETHAMINE 30 MG/ML IJ SOLN
30.0000 mg | Freq: Once | INTRAMUSCULAR | Status: AC
  Administered 2017-08-08: 30 mg via INTRAMUSCULAR
  Filled 2017-08-08: qty 1

## 2017-08-08 MED ORDER — TRAMADOL HCL 50 MG PO TABS: 50.0000 mg | ORAL_TABLET | Freq: Four times a day (QID) | ORAL | 0 refills | Status: DC | PRN

## 2017-08-08 MED ORDER — NAPROXEN 500 MG PO TABS: 500.0000 mg | ORAL_TABLET | Freq: Two times a day (BID) | ORAL | 0 refills | Status: DC

## 2017-08-08 NOTE — ED Triage Notes (Signed)
Pt c/o LLE pain from knee down since yesterday; denies injury; sts thinks r/t job

## 2017-08-08 NOTE — Discharge Instructions (Signed)
Ice and elevate your ankle and your knee. Avoid strenuous activity or prolonged walking and standing for the next several days. Naprosyn for pain. Tramadol for severe pain. Follow up with family doctor or orthopedics specialist if not improving.

## 2017-08-08 NOTE — ED Provider Notes (Signed)
[facility] EMERGENCY DEPARTMENT Provider Note   CSN: [id number] Arrival date & time: 08/08/17  1235     History   Chief Complaint Chief Complaint  Patient presents with  . Leg Pain    HPI Gabriela Thompson is a 41 y.o. female.  HPI Gabriela Thompson is a 41 y.o. female with history of CHF, hypertension, presents to emergency department complaining of a leg pain.  Patient states she has had left leg pain for approximately a week.  She states she did start a new job which requires a lot of standing.  She states that her pain is mainly in her left ankle but radiates up to her knee.  She states she has used over-the-counter pain medications such as ibuprofen and Tylenol which have not helped.  She has tried wrapping her ankle and Ace wrap which also did not help.  She states that yesterday she had a day off from work and stayed at home and elevated her leg, but this morning pain has gotten worse.  She states she has pain mainly when she bears weight on that leg.  She states it is better when she is staying off it.  She reports mild swelling in the ankle.  Denies any injuries.  No recent travel or surgeries.  No fever or chills.  No prior similar pain in the past.  She has been wearing the same shoes to work.  No history of blood clots.  Past Medical History:  Diagnosis Date  . CHF (congestive heart failure) (HCC)   . Heart valve problem   . High cholesterol   . Hypertension     There are no active problems to display for this patient.   Past Surgical History:  Procedure Laterality Date  . CESAREAN SECTION      OB History    Gravida Para Term Preterm AB Living   6 3 3  0 2 3   SAB TAB Ectopic Multiple Live Births   0 2 0 0         Home Medications    Prior to Admission medications   Medication Sig Start Date End Date Taking? Authorizing Provider  amLODipine (NORVASC) 10 MG tablet Take 10 mg by mouth daily.    [provider]  aspirin 81 MG tablet Take 81  mg by mouth daily.    [provider]  benzonatate (TESSALON) 100 MG capsule Take 1 capsule (100 mg total) by mouth every 8 (eight) hours. 07/12/16   Fayrene Helperran, Bowie, PA-C  carvedilol (COREG) 25 MG tablet Take 1 tablet (25 mg total) by mouth 2 (two) times daily with a meal. 09/16/16   Mabe, Latanya MaudlinMartha L, MD  cromolyn (OPTICROM) 4 % ophthalmic solution Place 1 drop into both eyes 4 (four) times daily. 07/20/14   Tyrone NineGrunz, Ryan B, MD  cyclobenzaprine (FLEXERIL) 10 MG tablet Take 1 tablet (10 mg total) by mouth 3 (three) times daily. 01/07/14   Elpidio AnisUpstill, Shari, PA-C  cyclobenzaprine (FLEXERIL) 10 MG tablet Take 1 tablet (10 mg total) by mouth 2 (two) times daily as needed for muscle spasms. 08/28/14   Mirian MoGentry, Matthew, MD  docusate sodium (COLACE) 100 MG capsule Take 1 capsule (100 mg total) by mouth every 12 (twelve) hours. 12/12/12   Rancour, Jeannett SeniorStephen, MD  furosemide (LASIX) 40 MG tablet Take 1 tablet (40 mg total) by mouth daily. 09/16/16   Mabe, Latanya MaudlinMartha L, MD  hydrochlorothiazide (HYDRODIURIL) 25 MG tablet Take 1 tablet (25 mg total) by mouth daily. 07/12/12  Gwyneth SproutPlunkett, Whitney, MD  HYDROcodone-acetaminophen (NORCO/VICODIN) 5-325 MG per tablet Take 1-2 tablets by mouth every 4 (four) hours as needed for moderate pain. 01/07/14   Elpidio AnisUpstill, Shari, PA-C  HYDROcodone-acetaminophen (NORCO/VICODIN) 5-325 MG per tablet Take 1 tablet by mouth every 6 (six) hours as needed for severe pain. 04/14/14   Gerhard MunchLockwood, Robert, MD  hydrocortisone (ANUSOL-HC) 2.5 % rectal cream Apply rectally 2 times daily 12/12/12   Rancour, Jeannett SeniorStephen, MD  hydrOXYzine (ATARAX/VISTARIL) 25 MG tablet Take 1 tablet (25 mg total) by mouth every 6 (six) hours. 08/16/12   Elson AreasSofia, Leslie K, PA-C  ibuprofen (ADVIL,MOTRIN) 800 MG tablet Take 1 tablet (800 mg total) by mouth 3 (three) times daily. 01/07/14   Elpidio AnisUpstill, Shari, PA-C  levofloxacin (LEVAQUIN) 750 MG tablet Take 1 tablet (750 mg total) by mouth daily. X 7 days 07/12/16   Fayrene Helperran, Bowie, PA-C  lisinopril  (PRINIVIL,ZESTRIL) 20 MG tablet Take 20 mg by mouth daily.    [provider]  LORazepam (ATIVAN) 1 MG tablet Take 1 tablet (1 mg total) by mouth every 8 (eight) hours. 08/16/12   Elson AreasSofia, Leslie K, PA-C  methyldopa (ALDOMET) 500 MG tablet Take 500 mg by mouth 3 (three) times daily.    [provider]  metoprolol succinate (TOPROL-XL) 100 MG 24 hr tablet Take 100 mg by mouth daily. Take with or immediately following a meal.    [provider]  metroNIDAZOLE (FLAGYL) 500 MG tablet Take 1 tablet (500 mg total) by mouth 2 (two) times daily. 12/12/12   Rancour, Jeannett SeniorStephen, MD  NIFEdipine (PROCARDIA-XL/ADALAT CC) 60 MG 24 hr tablet Take 1 tablet (60 mg total) by mouth daily. 09/16/16   Mabe, Latanya MaudlinMartha L, MD  ondansetron (ZOFRAN) 4 MG tablet Take 1 tablet (4 mg total) by mouth every 6 (six) hours. 12/12/12   Rancour, Jeannett SeniorStephen, MD  ondansetron (ZOFRAN-ODT) 4 MG disintegrating tablet Take 4 mg by mouth every 8 (eight) hours as needed for nausea.    [provider]  potassium chloride (K-DUR) 10 MEQ tablet Take 1 tablet (10 mEq total) by mouth daily. 09/16/16   Mabe, Latanya MaudlinMartha L, MD  Prenatal Vit-Fe Fumarate-FA (PRENATAL MULTIVITAMIN) TABS Take 1 tablet by mouth daily at 12 noon.    [provider]  trimethoprim-polymyxin b (POLYTRIM) ophthalmic solution Place 1 drop into the right eye every 4 (four) hours. 07/22/14   Pisciotta, Joni ReiningNicole, PA-C  Vitamin D, Ergocalciferol, (DRISDOL) 50000 units CAPS capsule Take 50,000 Units by mouth every 7 (seven) days.    [provider]    Family History No family history on file.  Social History Social History   Tobacco Use  . Smoking status: Current Every Day Smoker    Packs/day: 0.50    Types: Cigarettes  . Smokeless tobacco: Never Used  Substance Use Topics  . Alcohol use: Yes    Comment: social  . Drug use: No     Allergies   Patient has no known allergies.   Review of Systems Review of Systems  Constitutional:  Negative for chills and fever.  Respiratory: Negative for cough, chest tightness and shortness of breath.   Cardiovascular: Positive for leg swelling. Negative for chest pain and palpitations.  Gastrointestinal: Negative for abdominal pain, diarrhea, nausea and vomiting.  Musculoskeletal: Positive for arthralgias and joint swelling. Negative for myalgias, neck pain and neck stiffness.  Skin: Negative for rash.  Neurological: Negative for dizziness, weakness and headaches.  All other systems reviewed and are negative.    Physical Exam Updated Vital  Signs BP (!) 152/86   Pulse 86   Temp 98.5 F (36.9 C) (Oral)   Resp 18   LMP 07/24/2017   SpO2 99%   Physical Exam  Constitutional: She appears well-developed and well-nourished. No distress.  Eyes: Conjunctivae are normal.  Neck: Neck supple.  Musculoskeletal:  Normal-appearing no obvious swelling.  Patient is morbidly obese, exam is difficult to perform.  Diffuse tenderness over left ankle, mainly over anterior joint.  Full range of motion of the ankle joint.  Pain with flexion extension, as well as inversion and eversion.  Normal knee exam.  No tenderness to palpation over the calf.  Positive Homans sign. Dorsal pedal pulses intact and equal bilaterally  Neurological: She is alert.  Skin: Skin is warm and dry.  Nursing note and vitals reviewed.    ED Treatments / Results  Labs (all labs ordered are listed, but only abnormal results are displayed) Labs Reviewed - No data to display  EKG  EKG Interpretation None       Radiology No results found.  Procedures Procedures (including critical care time)  Medications Ordered in ED Medications  ketorolac (TORADOL) 30 MG/ML injection 30 mg (not administered)     Initial Impression / Assessment and Plan / ED Course  I have reviewed the triage vital signs and the nursing notes.  Pertinent labs & imaging results that were available during my care of the patient were  reviewed by me and considered in my medical decision making (see chart for details).     Patient in ED with atraumatic pain to the left leg.  Pain is specifically over ankle and the knee.  Patient does have worsened pain with bearing weight.  Differential includes arthritis, tendinitis, possible DVT.  No signs of infection.  Will get a venous duplex and x-ray of the ankle joint.  Will treat with Toradol.  3:21 PM Patient's x-ray is unremarkable.  Patient does have a popliteal cyst on the ultrasound, otherwise no signs of DVT or any other abnormalities.  There is no signs of infection.  Discussed treatment plan of starting on NSAIDs, keeping leg elevated at home, icing several times a day.  Discussed following up with family doctor or sports medicine if pain is not improved.  We will also give tramadol for severe pain and crutches to help her ambulate.  Patient agreeable to the plan.  Return precautions discussed.  Vitals:   08/08/17 1244  BP: (!) 152/86  Pulse: 86  Resp: 18  Temp: 98.5 F (36.9 C)  TempSrc: Oral  SpO2: 99%     Final Clinical Impressions(s) / ED Diagnoses   Final diagnoses:  Left leg pain  Synovial cyst of left popliteal space    ED Discharge Orders    None       Jaynie Crumble, PA-C 08/08/17 1534    Gwyneth Sprout, MD 08/09/17 [phone]

## 2017-10-07 ENCOUNTER — Encounter: Payer: Self-pay | Admitting: Neurology

## 2017-10-07 ENCOUNTER — Ambulatory Visit: Payer: Medicaid Other | Admitting: Neurology

## 2017-10-07 VITALS — BP 125/79 | HR 90 | Ht 71.0 in | Wt 371.0 lb

## 2017-10-07 DIAGNOSIS — G8929 Other chronic pain: Secondary | ICD-10-CM

## 2017-10-07 DIAGNOSIS — M25572 Pain in left ankle and joints of left foot: Secondary | ICD-10-CM | POA: Diagnosis not present

## 2017-10-07 NOTE — Progress Notes (Signed)
PATIENT: Gabriela Thompson DOB: [birth year]  Chief Complaint  Patient presents with  . Leg Pain    Reports bilateral leg pain, worsening since December 2018.  The pain radiates into both feet.  Symptoms causing her difficulty walking.  Marland Kitchen. PCP    Darryl Lentaylor, Amanda, PA-C     HISTORICAL  Gabriela Thompson is a 42 year old female, seen in refer by primary care PA Darryl Lentaylor, Amanda, for evaluation of neck pain, initial evaluation was on October 07, 2017.  I reviewed and summarized the referring note, she has past medical history of hypertension, obesity, CHF during pregnancy  Since December 2018, without clear triggers, she noticed bilateral lower extremity swelling, especially at the left ankle, radiating pain from left knee to left medial ankle difficulty bearing weight, intermittent numbness at the top of her left foot, and bottom of left foot, similar involvement but much milder degree of the right leg,  She also has a history of chronic knee pain, she was diagnosed with severe bilateral knee degenerative disease, potential candidate for knee replacement, is hindered because of overweight.    She has history of chronic low back pain, no bowel bladder incontinence.  REVIEW OF SYSTEMS: Full 14 system review of systems performed and notable only for weight gain, sweating legs, cough, swelling, achy muscles, headaches, snoring, restless leg  ALLERGIES: No Known Allergies  HOME MEDICATIONS: Current Outpatient Medications  Medication Sig Dispense Refill  . azithromycin (ZITHROMAX) 250 MG tablet Take by mouth daily.    . carvedilol (COREG) 25 MG tablet Take 1 tablet (25 mg total) by mouth 2 (two) times daily with a meal. 60 tablet 0  . Chlorphen-PE-Acetaminophen (NOREL AD) 4-10-325 MG TABS Take 1 tablet by mouth 2 (two) times daily.    Marland Kitchen. dextromethorphan-guaiFENesin (MUCINEX DM) 30-600 MG 12hr tablet Take 1 tablet by mouth 2 (two) times daily.     . furosemide (LASIX) 40 MG tablet Take 40 mg by  mouth daily.    Marland Kitchen. gabapentin (NEURONTIN) 300 MG capsule Take 900 mg by mouth 2 (two) times daily.    Marland Kitchen. NIFEdipine (PROCARDIA-XL/ADALAT CC) 60 MG 24 hr tablet Take 1 tablet (60 mg total) by mouth daily. 30 tablet 0  . omeprazole (PRILOSEC) 20 MG capsule Take 20 mg by mouth daily.    Marland Kitchen. oxyCODONE-acetaminophen (PERCOCET) 7.5-325 MG tablet Take 1 tablet by mouth every 6 (six) hours as needed for severe pain.      No current facility-administered medications for this visit.     PAST MEDICAL HISTORY: Past Medical History:  Diagnosis Date  . CHF (congestive heart failure) (HCC)   . Heart valve problem   . High cholesterol   . Hypertension   . Leg pain     PAST SURGICAL HISTORY: Past Surgical History:  Procedure Laterality Date  . CESAREAN SECTION      FAMILY HISTORY: Family History  Problem Relation Age of Onset  . Hypertension Mother   . Hypertension Father   . Diabetes Father   . Dementia Father   . Heart attack Maternal Grandmother   . Stroke Paternal Grandfather     SOCIAL HISTORY:  Social History   Socioeconomic History  . Marital status: Married    Spouse name: Not on file  . Number of children: 5  . Years of education: GED  . Highest education level: Not on file  Social Needs  . Financial resource strain: Not on file  . Food insecurity - worry: Not on file  . Food  insecurity - inability: Not on file  . Transportation needs - medical: Not on file  . Transportation needs - non-medical: Not on file  Occupational History  . Occupation: Unemployed  Tobacco Use  . Smoking status: Current Every Day Smoker    Packs/day: 0.50    Types: Cigarettes  . Smokeless tobacco: Never Used  Substance and Sexual Activity  . Alcohol use: Yes    Comment: 2 beers per night  . Drug use: No  . Sexual activity: Not Currently  Other Topics Concern  . Not on file  Social History Narrative   Lives with boyfriend and three of her five children.   Right-handed.   Average: 1 cup  every other day of caffeine.     PHYSICAL EXAM   Vitals:   10/07/17 0929  BP: 125/79  Pulse: 90  Weight: (!) 371 lb (168.3 kg)  Height: 5\' 11"  (1.803 m)    Not recorded      Body mass index is 51.74 kg/m.  PHYSICAL EXAMNIATION:  Gen: NAD, conversant, well nourised, obese, well groomed                     Cardiovascular: Regular rate rhythm, no peripheral edema, warm, nontender. Eyes: Conjunctivae clear without exudates or hemorrhage Neck: Supple, no carotid bruits. Pulmonary: Clear to auscultation bilaterally   NEUROLOGICAL EXAM:  MENTAL STATUS: Speech:    Speech is normal; fluent and spontaneous with normal comprehension.  Cognition:     Orientation to time, place and person     Normal recent and remote memory     Normal Attention span and concentration     Normal Language, naming, repeating,spontaneous speech     Fund of knowledge   CRANIAL NERVES: CN II: Visual fields are full to confrontation. Fundoscopic exam is normal with sharp discs and no vascular changes. Pupils are round equal and briskly reactive to light. CN III, IV, VI: extraocular movement are normal. No ptosis. CN V: Facial sensation is intact to pinprick in all 3 divisions bilaterally. Corneal responses are intact.  CN VII: Face is symmetric with normal eye closure and smile. CN VIII: Hearing is normal to rubbing fingers CN IX, X: Palate elevates symmetrically. Phonation is normal. CN XI: Head turning and shoulder shrug are intact CN XII: Tongue is midline with normal movements and no atrophy.  MOTOR: There is no pronator drift of out-stretched arms. Muscle bulk and tone are normal. Muscle strength is normal.  REFLEXES: Reflexes are 2+ and symmetric at the biceps, triceps, knees, and ankles. Plantar responses are flexor.  SENSORY: Intact to light touch, pinprick, positional sensation and vibratory sensation are intact in fingers and toes.  COORDINATION: Rapid alternating movements and fine  finger movements are intact. There is no dysmetria on finger-to-nose and heel-knee-shin.    GAIT/STANCE: She needs pushed up to get up from seated position, antalgic gait, tends to put weight in her left lateral foot, this is also limited by her big body habitus, left ankle pain, radiating pain from left lower extremity Romberg is absent.   DIAGNOSTIC DATA (LABS, IMAGING, TESTING) - I reviewed patient records, labs, notes, testing and imaging myself where available.   ASSESSMENT AND PLAN  Geralene Afshar is a 42 y.o. female   Left ankle pain, radiating pain of left leg   most likely related to the left ankle musculoskeletal etiology  EMG nerve conduction study obesity to rule out left lower extremity neuropathy     Levert Feinstein,  M.D. Ph.D.  [facility] [address], [address] [place], [place] [address] Ph: [phone] Fax: [phone]  CC: Darryl Lent, PA-C

## 2017-10-22 ENCOUNTER — Encounter: Payer: Medicaid Other | Admitting: Neurology

## 2019-09-15 ENCOUNTER — Other Ambulatory Visit: Payer: Self-pay

## 2019-09-15 DIAGNOSIS — Z20822 Contact with and (suspected) exposure to covid-19: Secondary | ICD-10-CM

## 2019-09-17 LAB — NOVEL CORONAVIRUS, NAA: SARS-CoV-2, NAA: NOT DETECTED

## 2019-09-22 ENCOUNTER — Other Ambulatory Visit: Payer: Self-pay

## 2019-09-22 DIAGNOSIS — Z20822 Contact with and (suspected) exposure to covid-19: Secondary | ICD-10-CM

## 2019-09-23 LAB — NOVEL CORONAVIRUS, NAA: SARS-CoV-2, NAA: NOT DETECTED

## 2019-11-19 ENCOUNTER — Emergency Department (HOSPITAL_BASED_OUTPATIENT_CLINIC_OR_DEPARTMENT_OTHER): Payer: Medicaid Other

## 2019-11-19 ENCOUNTER — Other Ambulatory Visit: Payer: Self-pay

## 2019-11-19 ENCOUNTER — Emergency Department (HOSPITAL_BASED_OUTPATIENT_CLINIC_OR_DEPARTMENT_OTHER)
Admission: EM | Admit: 2019-11-19 | Discharge: 2019-11-20 | Disposition: A | Discharge status: Home / Self Care | Payer: Medicaid Other | Attending: Emergency Medicine | Admitting: Emergency Medicine

## 2019-11-19 ENCOUNTER — Encounter (HOSPITAL_BASED_OUTPATIENT_CLINIC_OR_DEPARTMENT_OTHER): Payer: Self-pay | Admitting: Emergency Medicine

## 2019-11-19 DIAGNOSIS — I110 Hypertensive heart disease with heart failure: Secondary | ICD-10-CM | POA: Insufficient documentation

## 2019-11-19 DIAGNOSIS — I509 Heart failure, unspecified: Secondary | ICD-10-CM | POA: Diagnosis not present

## 2019-11-19 DIAGNOSIS — F17210 Nicotine dependence, cigarettes, uncomplicated: Secondary | ICD-10-CM | POA: Diagnosis not present

## 2019-11-19 DIAGNOSIS — Z79899 Other long term (current) drug therapy: Secondary | ICD-10-CM | POA: Insufficient documentation

## 2019-11-19 DIAGNOSIS — K529 Noninfective gastroenteritis and colitis, unspecified: Secondary | ICD-10-CM | POA: Insufficient documentation

## 2019-11-19 DIAGNOSIS — R1013 Epigastric pain: Secondary | ICD-10-CM | POA: Diagnosis present

## 2019-11-19 LAB — COMPREHENSIVE METABOLIC PANEL
ALT: 15 U/L (ref 0–44)
AST: 16 U/L (ref 15–41)
Albumin: 3.3 g/dL — ABNORMAL LOW (ref 3.5–5.0)
Alkaline Phosphatase: 70 U/L (ref 38–126)
Anion gap: 9 (ref 5–15)
BUN: 12 mg/dL (ref 6–20)
CO2: 23 mmol/L (ref 22–32)
Calcium: 8.6 mg/dL — ABNORMAL LOW (ref 8.9–10.3)
Chloride: 104 mmol/L (ref 98–111)
Creatinine, Ser: 0.87 mg/dL (ref 0.44–1.00)
GFR calc Af Amer: >60 mL/min (ref >60)
GFR calc non Af Amer: >60 mL/min (ref >60)
Glucose, Bld: 143 mg/dL — ABNORMAL HIGH (ref 70–99)
Potassium: 4.1 mmol/L (ref 3.5–5.1)
Sodium: 136 mmol/L (ref 135–145)
Total Bilirubin: 0.4 mg/dL (ref 0.3–1.2)
Total Protein: 7 g/dL (ref 6.5–8.1)

## 2019-11-19 LAB — URINALYSIS, ROUTINE W REFLEX MICROSCOPIC
Bilirubin Urine: NEGATIVE
Glucose, UA: NEGATIVE mg/dL
Hgb urine dipstick: NEGATIVE
Ketones, ur: NEGATIVE mg/dL
Nitrite: NEGATIVE
Protein, ur: NEGATIVE mg/dL
Specific Gravity, Urine: 1.025 (ref 1.005–1.030)
pH: 6 (ref 5.0–8.0)

## 2019-11-19 LAB — URINALYSIS, MICROSCOPIC (REFLEX)

## 2019-11-19 LAB — CBC WITH DIFFERENTIAL/PLATELET
Abs Immature Granulocytes: 0.17 10*3/uL — ABNORMAL HIGH (ref 0.00–0.07)
Basophils Absolute: 0.1 10*3/uL (ref 0.0–0.1)
Basophils Relative: 1 %
Eosinophils Absolute: 0.7 10*3/uL — ABNORMAL HIGH (ref 0.0–0.5)
Eosinophils Relative: 5 %
HCT: 37.6 % (ref 36.0–46.0)
Hemoglobin: 11.2 g/dL — ABNORMAL LOW (ref 12.0–15.0)
Immature Granulocytes: 1 %
Lymphocytes Relative: 19 %
Lymphs Abs: 2.7 10*3/uL (ref 0.7–4.0)
MCH: 23.1 pg — ABNORMAL LOW (ref 26.0–34.0)
MCHC: 29.8 g/dL — ABNORMAL LOW (ref 30.0–36.0)
MCV: 77.7 fL — ABNORMAL LOW (ref 80.0–100.0)
Monocytes Absolute: 0.8 10*3/uL (ref 0.1–1.0)
Monocytes Relative: 5 %
Neutro Abs: 9.9 10*3/uL — ABNORMAL HIGH (ref 1.7–7.7)
Neutrophils Relative %: 69 %
Platelets: 384 10*3/uL (ref 150–400)
RBC: 4.84 MIL/uL (ref 3.87–5.11)
RDW: 18 % — ABNORMAL HIGH (ref 11.5–15.5)
WBC: 14.3 10*3/uL — ABNORMAL HIGH (ref 4.0–10.5)
nRBC: 0 % (ref 0.0–0.2)

## 2019-11-19 LAB — PREGNANCY, URINE: Preg Test, Ur: NEGATIVE

## 2019-11-19 LAB — LIPASE, BLOOD: Lipase: 39 U/L (ref 11–51)

## 2019-11-19 MED ORDER — MORPHINE SULFATE (PF) 4 MG/ML IV SOLN
4.0000 mg | Freq: Once | INTRAVENOUS | Status: AC
  Administered 2019-11-19: 4 mg via INTRAVENOUS
  Filled 2019-11-19: qty 1

## 2019-11-19 MED ORDER — SODIUM CHLORIDE 0.9 % IV BOLUS
500.0000 mL | Freq: Once | INTRAVENOUS | Status: AC
  Administered 2019-11-19: 500 mL via INTRAVENOUS

## 2019-11-19 MED ORDER — ONDANSETRON HCL 4 MG/2ML IJ SOLN
4.0000 mg | Freq: Once | INTRAMUSCULAR | Status: AC
  Administered 2019-11-19: 4 mg via INTRAVENOUS
  Filled 2019-11-19: qty 2

## 2019-11-19 MED ORDER — IOHEXOL 300 MG/ML  SOLN
100.0000 mL | Freq: Once | INTRAMUSCULAR | Status: AC | PRN
  Administered 2019-11-19: 23:00:00 100 mL via INTRAVENOUS

## 2019-11-19 NOTE — ED Provider Notes (Signed)
[facility] EMERGENCY DEPARTMENT Provider Note   CSN: [id number] Arrival date & time: 11/19/19  2049     History Chief Complaint  Patient presents with  . Abdominal Pain    Gabriela Thompson is a 44 y.o. female.  Patient is a 44 year old female with a history of CHF, hypertension and hyperlipidemia  who presents with abdominal pain.  She said that about 11:00 this morning she noted some sharp pain in the upper part of her belly.  Its been like contraction pain.  Its nonradiating.  There is no associated back pain.  She does have some nausea but no vomiting.  No change in bowels.  No urinary symptoms.  She had a similar type pain a few months ago and it lasted 3 days.  She came in today because it was pretty severe last time and she did not get it checked out.  She took some Percocet that she had at home and Pepto-Bismol with no significant improvement in symptoms.        Past Medical History:  Diagnosis Date  . CHF (congestive heart failure) (HCC)   . Heart valve problem   . High cholesterol   . Hypertension   . Leg pain     Patient Active Problem List   Diagnosis Date Noted  . Chronic pain of left ankle 10/07/2017    Past Surgical History:  Procedure Laterality Date  . CESAREAN SECTION       OB History    Gravida  6   Para  3   Term  3   Preterm  0   AB  2   Living  3     SAB  0   TAB  2   Ectopic  0   Multiple  0   Live Births              Family History  Problem Relation Age of Onset  . Hypertension Mother   . Hypertension Father   . Diabetes Father   . Dementia Father   . Heart attack Maternal Grandmother   . Stroke Paternal Grandfather     Social History   Tobacco Use  . Smoking status: Current Every Day Smoker    Packs/day: 0.50    Types: Cigarettes  . Smokeless tobacco: Never Used  Substance Use Topics  . Alcohol use: Yes    Comment: 2 beers per night  . Drug use: No    Home Medications Prior to Admission  medications   Medication Sig Start Date End Date Taking? Authorizing Provider  azithromycin (ZITHROMAX) 250 MG tablet Take by mouth daily.    [provider]  carvedilol (COREG) 25 MG tablet Take 1 tablet (25 mg total) by mouth 2 (two) times daily with a meal. 09/16/16   Mabe, Latanya Maudlin, MD  Chlorphen-PE-Acetaminophen (NOREL AD) 4-10-325 MG TABS Take 1 tablet by mouth 2 (two) times daily.    [provider]  dextromethorphan-guaiFENesin (MUCINEX DM) 30-600 MG 12hr tablet Take 1 tablet by mouth 2 (two) times daily.     [provider]  dicyclomine (BENTYL) 20 MG tablet Take 1 tablet (20 mg total) by mouth 2 (two) times daily. 11/20/19   Rolan Bucco, MD  furosemide (LASIX) 40 MG tablet Take 40 mg by mouth daily.    [provider]  gabapentin (NEURONTIN) 300 MG capsule Take 900 mg by mouth 2 (two) times daily.    [provider]  NIFEdipine (PROCARDIA-XL/ADALAT CC) 60 MG  24 hr tablet Take 1 tablet (60 mg total) by mouth daily. 09/16/16   Mabe, Latanya Maudlin, MD  omeprazole (PRILOSEC) 20 MG capsule Take 20 mg by mouth daily.    [provider]  ondansetron (ZOFRAN ODT) 4 MG disintegrating tablet 4mg  ODT q4 hours prn nausea/vomit 11/20/19   11/22/19, MD  oxyCODONE-acetaminophen (PERCOCET) 7.5-325 MG tablet Take 1 tablet by mouth every 6 (six) hours as needed for severe pain.     [provider]    Allergies    Patient has no known allergies.  Review of Systems   Review of Systems  Constitutional: Negative for chills, diaphoresis, fatigue and fever.  HENT: Negative for congestion, rhinorrhea and sneezing.   Eyes: Negative.   Respiratory: Negative for cough, chest tightness and shortness of breath.   Cardiovascular: Negative for chest pain and leg swelling.  Gastrointestinal: Positive for abdominal pain and nausea. Negative for blood in stool, diarrhea and vomiting.  Genitourinary: Negative for difficulty urinating, flank pain,  frequency and hematuria.  Musculoskeletal: Negative for arthralgias and back pain.  Skin: Negative for rash.  Neurological: Negative for dizziness, speech difficulty, weakness, numbness and headaches.    Physical Exam Updated Vital Signs BP 125/81   Pulse 93   Temp 98.5 F (36.9 C) (Oral)   Resp 20   Ht 5\' 11"  (1.803 m)   Wt (!) 167.8 kg   LMP 10/31/2019   SpO2 99%   BMI 51.60 kg/m   Physical Exam Constitutional:      Appearance: She is well-developed.  HENT:     Head: Normocephalic and atraumatic.  Eyes:     Pupils: Pupils are equal, round, and reactive to light.  Cardiovascular:     Rate and Rhythm: Normal rate and regular rhythm.     Heart sounds: Normal heart sounds.  Pulmonary:     Effort: Pulmonary effort is normal. No respiratory distress.     Breath sounds: Normal breath sounds. No wheezing or rales.  Chest:     Chest wall: No tenderness.  Abdominal:     General: Bowel sounds are normal.     Palpations: Abdomen is soft.     Tenderness: There is abdominal tenderness in the epigastric area. There is no guarding or rebound.     Comments: Positive tenderness to epigastrium with mild tenderness to the right upper and left upper quadrants.  Musculoskeletal:        General: Normal range of motion.     Cervical back: Normal range of motion and neck supple.  Lymphadenopathy:     Cervical: No cervical adenopathy.  Skin:    General: Skin is warm and dry.     Findings: No rash.  Neurological:     Mental Status: She is alert and oriented to person, place, and time.     ED Results / Procedures / Treatments   Labs (all labs ordered are listed, but only abnormal results are displayed) Labs Reviewed  COMPREHENSIVE METABOLIC PANEL - Abnormal; Notable for the following components:      Result Value   Glucose, Bld 143 (*)    Calcium 8.6 (*)    Albumin 3.3 (*)    All other components within normal limits  CBC WITH DIFFERENTIAL/PLATELET - Abnormal; Notable for the  following components:   WBC 14.3 (*)    Hemoglobin 11.2 (*)    MCV 77.7 (*)    MCH 23.1 (*)    MCHC 29.8 (*)    RDW 18.0 (*)  Neutro Abs 9.9 (*)    Eosinophils Absolute 0.7 (*)    Abs Immature Granulocytes 0.17 (*)    All other components within normal limits  URINALYSIS, ROUTINE W REFLEX MICROSCOPIC - Abnormal; Notable for the following components:   APPearance HAZY (*)    Leukocytes,Ua SMALL (*)    All other components within normal limits  URINALYSIS, MICROSCOPIC (REFLEX) - Abnormal; Notable for the following components:   Bacteria, UA MANY (*)    All other components within normal limits  LIPASE, BLOOD  PREGNANCY, URINE    EKG None  Radiology CT Abdomen Pelvis W Contrast  Result Date: 11/19/2019 CLINICAL DATA:  Upper abdominal pain EXAM: CT ABDOMEN AND PELVIS WITH CONTRAST TECHNIQUE: Multidetector CT imaging of the abdomen and pelvis was performed using the standard protocol following bolus administration of intravenous contrast. CONTRAST:  OMNIPAQUE IOHEXOL 300 MG/ML  SOLN COMPARISON:  None. FINDINGS: Lower chest: Lung bases are clear. Hepatobiliary: Liver is within normal limits. Gallbladder is unremarkable. No intrahepatic or extrahepatic ductal dilatation. Pancreas: Within normal limits. Spleen: Within normal limits. Adrenals/Urinary Tract: Adrenal glands are within normal limits. Bilateral renal cysts, measuring up to 14 mm in the right lower pole. No hydronephrosis. Bladder is grossly unremarkable. Stomach/Bowel: Stomach is within normal limits. No evidence of bowel obstruction. Mild stranding along the left mid abdominal mesentery (series 2/image 48), possibly reflecting small bowel enteritis. No bowel wall thickening/pneumatosis.  No free air. Normal appendix (series 2/image 63). Vascular/Lymphatic: No evidence of abdominal aortic aneurysm. No suspicious abdominopelvic lymphadenopathy. Reproductive: Heterogeneous appearance of the uterus, suggesting uterine fibroids.  No adnexal masses. Other: Small volume abdominopelvic ascites, possibly minimally complex. Mild diastasis of the lower midline anterior abdominal wall (series 2/image 74). No frank ventral hernia. No evidence of inguinal hernia. 2.2 x 3.2 x 1.9 cm hyperdense lesion along the right paramidline lower anterior pelvic wall (series 2/image 39), possibly reflecting abdominal wall hemorrhage versus a small mass such as endometrioma or desmoid tumor. Musculoskeletal: Mild degenerative changes of the visualized thoracolumbar spine. IMPRESSION: Mesenteric fluid/stranding along the jejunal mesentery in the left mid abdomen, suggesting small bowel enteritis. No pneumatosis or free air. Small volume abdominopelvic ascites, possibly minimally complex, although likely physiologic. 3.2 cm hyperdense lesion along the right paramidline lower anterior pelvic wall, indeterminate. This appearance could reflect a small abdominal wall hematoma. However, underlying mass such as endometrioma or desmoid tumor are not excluded. Consider follow-up pelvic MR with/without contrast in 4-6 weeks. Suspected uterine fibroids. Electronically Signed   By: Charline Bills M.D.   On: 11/19/2019 23:45    Procedures Procedures (including critical care time)  Medications Ordered in ED Medications  morphine 4 MG/ML injection 4 mg (4 mg Intravenous Given 11/19/19 2156)  ondansetron (ZOFRAN) injection 4 mg (4 mg Intravenous Given 11/19/19 2156)  sodium chloride 0.9 % bolus 500 mL (0 mLs Intravenous Stopped 11/19/19 2321)  iohexol (OMNIPAQUE) 300 MG/ML solution 100 mL (100 mLs Intravenous Contrast Given 11/19/19 2313)    ED Course  I have reviewed the triage vital signs and the nursing notes.  Pertinent labs & imaging results that were available during my care of the patient were reviewed by me and considered in my medical decision making (see chart for details).    MDM Rules/Calculators/A&P                      Patient is a 44 year old  female who presents with abdominal pain.  Her labs are nonconcerning.  There is  no evidence of pancreatitis.  No evidence of elevation in her liver enzymes suggestive of gallbladder disease.  Her pregnancy test is negative.  Her urine is unremarkable.  She had a CT scan performed which shows no acute abnormalities other than some mild enteritis..  Her gallbladder looks normal.  There is no kidney stone.  No obstruction.  She does have a small hematoma in her pelvic area.  The radiologist is recommending that she get an MRI in 4 to 6 weeks for further characterization to exclude a mass.  I did advise the patient of this.  She is feeling much better after treatment in the ED.  There was suggestions of enteritis on the CT scan.  This is likely the etiology of her symptoms.  She has no vomiting.  No ongoing pain.  She was discharged home in good condition.  She was given a prescription for Bentyl and Zofran.  She was encouraged to follow-up with her PCP.  Return precautions were given.  Final Clinical  Impression(s).ED Diagnoses Final diagnoses:  Enteritis    Rx / DC Orders ED Discharge Orders         Ordered    dicyclomine (BENTYL) 20 MG tablet  2 times daily     11/20/19 0021    ondansetron (ZOFRAN ODT) 4 MG disintegrating tablet     11/20/19 0021           Malvin Johns, MD 11/20/19 0025

## 2019-11-19 NOTE — ED Triage Notes (Signed)
Reports abdominal pain above umbilicus that started this morning.  Reports history of the same a few months ago that went away on its own.  Also c/o nausea.  No vomiting or diarrhea.

## 2019-11-20 MED ORDER — DICYCLOMINE HCL 20 MG PO TABS: 20.0000 mg | ORAL_TABLET | Freq: Two times a day (BID) | ORAL | 0 refills | Status: AC

## 2019-11-20 MED ORDER — ONDANSETRON 4 MG PO TBDP: ORAL_TABLET | ORAL | 0 refills | Status: AC

## 2019-11-20 NOTE — Discharge Instructions (Addendum)
Maintain clear liquid maintain a clear liquid diet for the next 24 hours.  You have a small enlarged area on your pelvic wall.  This could represent a small hematoma but the radiologist is recommending that you have a pelvic MRI to further assess this in about 4 to 6 weeks.  You can follow-up with your doctor regarding this.  Return to the emergency room if you have any worsening pain, vomiting, fevers or other worsening symptoms.

## 2020-11-25 ENCOUNTER — Encounter (HOSPITAL_BASED_OUTPATIENT_CLINIC_OR_DEPARTMENT_OTHER): Payer: Self-pay

## 2020-11-25 ENCOUNTER — Emergency Department (HOSPITAL_BASED_OUTPATIENT_CLINIC_OR_DEPARTMENT_OTHER): Payer: Medicaid Other

## 2020-11-25 ENCOUNTER — Emergency Department (HOSPITAL_BASED_OUTPATIENT_CLINIC_OR_DEPARTMENT_OTHER)
Admission: EM | Admit: 2020-11-25 | Discharge: 2020-11-26 | Disposition: A | Discharge status: Home / Self Care | Payer: Medicaid Other | Attending: Emergency Medicine | Admitting: Emergency Medicine

## 2020-11-25 ENCOUNTER — Other Ambulatory Visit: Payer: Self-pay

## 2020-11-25 DIAGNOSIS — Z79899 Other long term (current) drug therapy: Secondary | ICD-10-CM | POA: Diagnosis not present

## 2020-11-25 DIAGNOSIS — I509 Heart failure, unspecified: Secondary | ICD-10-CM | POA: Insufficient documentation

## 2020-11-25 DIAGNOSIS — I110 Hypertensive heart disease with heart failure: Secondary | ICD-10-CM | POA: Insufficient documentation

## 2020-11-25 DIAGNOSIS — L84 Corns and callosities: Secondary | ICD-10-CM | POA: Insufficient documentation

## 2020-11-25 DIAGNOSIS — F17210 Nicotine dependence, cigarettes, uncomplicated: Secondary | ICD-10-CM | POA: Insufficient documentation

## 2020-11-25 DIAGNOSIS — M79671 Pain in right foot: Secondary | ICD-10-CM | POA: Diagnosis present

## 2020-11-25 DIAGNOSIS — E119 Type 2 diabetes mellitus without complications: Secondary | ICD-10-CM | POA: Insufficient documentation

## 2020-11-25 HISTORY — DX: Type 2 diabetes mellitus without complications: E11.9

## 2020-11-25 NOTE — ED Notes (Signed)
Patient transported to X-ray 

## 2020-11-25 NOTE — ED Triage Notes (Signed)
Emergency Medicine Provider Triage Evaluation Note  Gabriela Thompson , a 45 y.o. female  was evaluated in triage.  Pt complains of r foot pain x few months.  Review of Systems  Positive: Foot pain  Negative: Numbness o  Physical Exam  BP (!) 169/107   Pulse 94   Temp 97.9 F (36.6 C) (Oral)   Resp 18   Ht 5\' 11"  (1.803 m)   Wt (!) 167.4 kg   LMP 10/31/2019   SpO2 99%   BMI 51.47 kg/m  Gen:   Awake, no distress   HEENT:  Atraumatic  Resp:  Normal effort  Cardiac:  Normal rate  Abd:   Nondistended, nontender  MSK:   Moves extremities without difficulty  Neuro:  Speech clear   Medical Decision Making  Medically screening exam initiated at 7:31 PM.  Appropriate orders placed.  Mellisa Arshad was informed that the remainder of the evaluation will be completed by another provider, this initial triage assessment does not replace that evaluation, and the importance of remaining in the ED until their evaluation is complete.  Clinical Impression  Patient appears to have a corn. Xray for FB initiated    Valarie Cones, Arthor Captain 11/25/20 1932

## 2020-11-25 NOTE — ED Triage Notes (Signed)
Pt presents with complaints of a painful to touch wound to her R foot. Is unsure of how she may have injured it.

## 2020-11-26 NOTE — ED Provider Notes (Signed)
[facility] EMERGENCY DEPARTMENT Provider Note   CSN: [id number] Arrival date & time: 11/25/20  1847     History Chief Complaint  Patient presents with  . Foot Pain    Gabriela Thompson is a 45 y.o. female.  The history is provided by the patient.   Gabriela Thompson is a 45 y.o. female who presents to the Emergency Department complaining of foot pain.  She presents to the ED complaining of pain to her right foot for three weeks, thinks there migh be something in it. Her family looked at her foot and thought there was a foreign body and were digging out it with tweezers. She has pain on standing to the foot that is mild in nature. She is a borderline diabetic. Does not take any blood thinners.    Past Medical History:  Diagnosis Date  . CHF (congestive heart failure) (HCC)   . Diabetes mellitus without complication (HCC)   . Heart valve problem   . High cholesterol   . Hypertension   . Leg pain     Patient Active Problem List   Diagnosis Date Noted  . Chronic pain of left ankle 10/07/2017    Past Surgical History:  Procedure Laterality Date  . CESAREAN SECTION       OB History    Gravida  6   Para  3   Term  3   Preterm  0   AB  2   Living  3     SAB  0   IAB  2   Ectopic  0   Multiple  0   Live Births              Family History  Problem Relation Age of Onset  . Hypertension Mother   . Hypertension Father   . Diabetes Father   . Dementia Father   . Heart attack Maternal Grandmother   . Stroke Paternal Grandfather     Social History   Tobacco Use  . Smoking status: Current Every Day Smoker    Packs/day: 1.00    Types: Cigarettes  . Smokeless tobacco: Never Used  Vaping Use  . Vaping Use: Never used  Substance Use Topics  . Alcohol use: Yes  . Drug use: No    Home Medications Prior to Admission medications   Medication Sig Start Date End Date Taking? Authorizing Provider  azithromycin (ZITHROMAX) 250 MG tablet Take by  mouth daily.    [provider]  carvedilol (COREG) 25 MG tablet Take 1 tablet (25 mg total) by mouth 2 (two) times daily with a meal. 09/16/16   Mabe, Latanya Maudlin, MD  Chlorphen-PE-Acetaminophen (NOREL AD) 4-10-325 MG TABS Take 1 tablet by mouth 2 (two) times daily.    [provider]  dextromethorphan-guaiFENesin (MUCINEX DM) 30-600 MG 12hr tablet Take 1 tablet by mouth 2 (two) times daily.     [provider]  dicyclomine (BENTYL) 20 MG tablet Take 1 tablet (20 mg total) by mouth 2 (two) times daily. 11/20/19   Rolan Bucco, MD  furosemide (LASIX) 40 MG tablet Take 40 mg by mouth daily.    [provider]  gabapentin (NEURONTIN) 300 MG capsule Take 900 mg by mouth 2 (two) times daily.    [provider]  NIFEdipine (PROCARDIA-XL/ADALAT CC) 60 MG 24 hr tablet Take 1 tablet (60 mg total) by mouth daily. 09/16/16   Mabe, Latanya Maudlin, MD  omeprazole (PRILOSEC) 20 MG capsule Take 20 mg by  mouth daily.    [provider]  ondansetron (ZOFRAN ODT) 4 MG disintegrating tablet 4mg  ODT q4 hours prn nausea/vomit 11/20/19   11/22/19, MD  oxyCODONE-acetaminophen (PERCOCET) 7.5-325 MG tablet Take 1 tablet by mouth every 6 (six) hours as needed for severe pain.     [provider]    Allergies    Patient has no known allergies.  Review of Systems   Review of Systems  All other systems reviewed and are negative.   Physical Exam Updated Vital Signs BP (!) 169/107   Pulse 94   Temp 97.9 F (36.6 C) (Oral)   Resp 18   Ht 5\' 11"  (1.803 m)   Wt (!) 167.4 kg   LMP 10/31/2019   SpO2 99%   BMI 51.47 kg/m   Physical Exam Vitals and nursing note reviewed.  Constitutional:      Appearance: She is well-developed.  HENT:     Head: Normocephalic and atraumatic.  Cardiovascular:     Rate and Rhythm: Normal rate and regular rhythm.  Pulmonary:     Effort: Pulmonary effort is normal. No respiratory distress.  Musculoskeletal:        General:  No tenderness.     Comments: 2+ DP pulses.  There is a 1/2 cm area of firmness to the plantar surface of the right foot.    Skin:    General: Skin is warm and dry.  Neurological:     Mental Status: She is alert and oriented to person, place, and time.  Psychiatric:        Behavior: Behavior normal.     ED Results / Procedures / Treatments   Labs (all labs ordered are listed, but only abnormal results are displayed) Labs Reviewed - No data to display  EKG None  Radiology DG Foot Complete Right  Result Date: 11/25/2020 CLINICAL DATA:  Right foot pain for 3 weeks. Pain along the lateral side at the midfoot. Possible foreign body. EXAM: RIGHT FOOT COMPLETE - 3+ VIEW COMPARISON:  None. FINDINGS: Degenerative changes in the first metatarsal-phalangeal and intertarsal joints. No evidence of acute fracture or dislocation. No focal bone lesion or bone destruction. Small calcaneal spurs. No radiopaque foreign bodies are identified. IMPRESSION: Degenerative changes in the first metatarsal-phalangeal and intertarsal joints. No radiopaque foreign bodies. Electronically Signed   By: 12/31/2019 M.D.   On: 11/25/2020 20:00    Procedures Procedures   Medications Ordered in ED Medications - No data to display  ED Course  I have reviewed the triage vital signs and the nursing notes.  Pertinent labs & imaging results that were available during my care of the patient were reviewed by me and considered in my medical decision making (see chart for details).    MDM Rules/Calculators/A&P                         patient here for evaluation of three weeks of atraumatic foot pain. No report of injury to the area, plan films are negative for foreign body. Examination is consistent with faecalis. Discussed with patient home foot care. Discussed PCP follow-up and return precautions. There is no evidence of acute infectious process at this time. Final Clinical Impression(s) / ED Diagnoses Final  diagnoses:  Callus of foot    Rx / DC Orders ED Discharge Orders    None       Burman Nieves, MD 11/26/20 0013
